# Patient Record
Sex: Male | Born: 1995 | Race: White | Hispanic: No | Marital: Single | Smoking: Never smoker
Health system: Southern US, Community
[De-identification: ages and names within clinical notes are randomized; demographics above are authoritative.]

## PROBLEM LIST (undated history)

## (undated) ENCOUNTER — Ambulatory Visit: Admission: EM

## (undated) DIAGNOSIS — M25561 Pain in right knee: Secondary | ICD-10-CM

## (undated) HISTORY — DX: Pain in right knee: M25.561

---
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
## 2016-02-01 ENCOUNTER — Ambulatory Visit (INDEPENDENT_AMBULATORY_CARE_PROVIDER_SITE_OTHER): Payer: 59 | Admitting: Physician Assistant

## 2016-02-01 VITALS — BP 142/80 | HR 83 | Temp 98.2°F | Resp 16 | Ht 70.0 in | Wt 221.8 lb

## 2016-02-01 DIAGNOSIS — J020 Streptococcal pharyngitis: Secondary | ICD-10-CM | POA: Diagnosis not present

## 2016-02-01 DIAGNOSIS — J029 Acute pharyngitis, unspecified: Secondary | ICD-10-CM | POA: Diagnosis not present

## 2016-02-01 LAB — POCT RAPID STREP A (OFFICE): Rapid Strep A Screen: NEGATIVE

## 2016-02-01 MED ORDER — AMOXICILLIN 500 MG PO CAPS: 500.0000 mg | ORAL_CAPSULE | Freq: Two times a day (BID) | ORAL | 0 refills | Status: AC

## 2016-02-01 NOTE — Progress Notes (Signed)
Tanner ChangJoshua Flores  MRN: [id number] DOB: 1996-03-15  PCP: No PCP Per Patient  Subjective:  Pt is a 20 year old male presenting to clinic for sore throat x3 days. He had a fever of 101.5 three days ago. Since that time he has developed a sore throat, burning with swallowing, difficulty sleeping. States he has to whisper because it hurts to talk. His mother noticed "white stuff" on his tonsils yesterday.  He had to leave work early yesterday, slept all afternoon.  He has tried salt water gargles, no relief  No sick contacts. Unsure if he has had mono in the past.  No recent sexual contacts.  Review of Systems  Constitutional: Positive for chills, fatigue and fever.  HENT: Positive for sore throat, trouble swallowing and voice change.   Respiratory: Negative.   Cardiovascular: Negative.   Gastrointestinal: Negative.   Skin: Negative for rash.    There are no active problems to display for this patient.   Current Outpatient Prescriptions on File Prior to Visit  Medication Sig Dispense Refill  . HYDROcodone-acetaminophen (NORCO) 5-325 MG tablet Take 1 tablet by mouth every 8 (eight) hours as needed for severe pain. For severe pain only. 15 tablet 0  . ibuprofen (ADVIL,MOTRIN) 600 MG tablet Take 1 tablet (600 mg total) by mouth every 8 (eight) hours as needed. 30 tablet 0  . SUMAtriptan (IMITREX) 25 MG tablet Take 25 mg by mouth as needed. headache  2  . cephALEXin (KEFLEX) 500 MG capsule Take 1 capsule (500 mg total) by mouth 3 (three) times daily. (Patient not taking: Reported on 02/01/2016) 21 capsule 0   No current facility-administered medications on file prior to visit.     No Known Allergies  Objective:  BP (!) 142/80 (BP Location: Right Arm, Patient Position: Sitting, Cuff Size: Normal)   Pulse 83   Temp 98.2 F (36.8 C) (Oral)   Resp 16   Ht 5\' 10"  (1.778 m)   Wt 221 lb 12.8 oz (100.6 kg)   SpO2 99%   BMI 31.82 kg/m   Physical Exam  Constitutional: He is oriented  to person, place, and time and well-developed, well-nourished, and in no distress. No distress.  HENT:  Mouth/Throat: Uvula is midline and mucous membranes are normal. No uvula swelling. Posterior oropharyngeal edema and posterior oropharyngeal erythema present. No oropharyngeal exudate or tonsillar abscesses.  Eyes: Conjunctivae are normal. Right eye exhibits no discharge. Left eye exhibits no discharge.  Cardiovascular: Normal rate, regular rhythm and normal heart sounds.   Pulmonary/Chest: Effort normal and breath sounds normal.  Lymphadenopathy:       Head (right side): Preauricular and posterior auricular adenopathy present.       Head (left side): Preauricular and posterior auricular adenopathy present.  Neurological: He is alert and oriented to person, place, and time. GCS score is 15.  Skin: Skin is warm and dry.  Psychiatric: Mood, memory, affect and judgment normal.  Vitals reviewed.  Results for orders placed or performed in visit on 02/01/16  POCT rapid strep A  Result Value Ref Range   Rapid Strep A Screen Negative Negative    Assessment and Plan :  1. Sore throat - POCT rapid strep A - Culture, Group A Strep 2. Streptococcal sore throat - amoxicillin (AMOXIL) 500 MG capsule; Take 1 capsule (500 mg total) by mouth 2 (two) times daily.  Dispense: 14 capsule; Refill: 0 - I believe this is a false positive strep test. Will treat.   Tanner Johan Creveling, PA-C  [facility] 02/01/2016 12:12 PM

## 2016-02-01 NOTE — Patient Instructions (Addendum)
IF you received an x-ray today, you will receive an invoice from [facility]. Please contact [facility] at [phone] with questions or concerns regarding your invoice.   IF you received labwork today, you will receive an invoice from [facility]. Please contact [facility] at [phone] with questions or concerns regarding your invoice.   Our billing staff will not be able to assist you with questions regarding bills from these companies.  You will be contacted with the lab results as soon as they are available. The fastest way to get your results is to activate your My Chart account. Instructions are located on the last page of this paperwork. If you have not heard from us regarding the results in 2 weeks, please contact this office.     s Strep Throat Strep throat is a bacterial infection of the throat. Your health care provider may call the infection tonsillitis or pharyngitis, depending on whether there is swelling in the tonsils or at the back of the throat. Strep throat is most common during the cold months of the year in children who are 255-19 years of age, but it can happen during any season in people of any age. This infection is spread from person to person (contagious) through coughing, sneezing, or close contact. CAUSES Strep throat is caused by the bacteria called Streptococcus pyogenes. RISK FACTORS This condition is more likely to develop in:  People who spend time in crowded places where the infection can spread easily.  People who have close contact with someone who has strep throat. SYMPTOMS Symptoms of this condition include:  Fever or chills.   Redness, swelling, or pain in the tonsils or throat.  Pain or difficulty when swallowing.  White or yellow spots on the tonsils or throat.  Swollen, tender glands in the neck or under the jaw.  Red rash all over the body (rare). DIAGNOSIS This condition is diagnosed  by performing a rapid strep test or by taking a swab of your throat (throat culture test). Results from a rapid strep test are usually ready in a few minutes, but throat culture test results are available after one or two days. TREATMENT This condition is treated with antibiotic medicine. HOME CARE INSTRUCTIONS Medicines  Take over-the-counter and prescription medicines only as told by your health care provider.  Take your antibiotic as told by your health care provider. Do not stop taking the antibiotic even if you start to feel better.  Have family members who also have a sore throat or fever tested for strep throat. They may need antibiotics if they have the strep infection. Eating and Drinking  Do not share food, drinking cups, or personal items that could cause the infection to spread to other people.  If swallowing is difficult, try eating soft foods until your sore throat feels better.  Drink enough fluid to keep your urine clear or pale yellow. General Instructions  Gargle with a salt-water mixture 3-4 times per day or as needed. To make a salt-water mixture, completely dissolve -1 tsp of salt in 1 cup of warm water.  Make sure that all household members wash their hands well.  Get plenty of rest.  Stay home from school or work until you have been taking antibiotics for 24 hours.  Keep all follow-up visits as told by your health care provider. This is important. SEEK MEDICAL CARE IF:  The glands in your neck continue to get bigger.  You develop a rash, cough,  or earache.  You cough up a thick liquid that is green, yellow-brown, or bloody.  You have pain or discomfort that does not get better with medicine.  Your problems seem to be getting worse rather than better.  You have a fever. SEEK IMMEDIATE MEDICAL CARE IF:  You have new symptoms, such as vomiting, severe headache, stiff or painful neck, chest pain, or shortness of breath.  You have severe throat pain,  drooling, or changes in your voice.  You have swelling of the neck, or the skin on the neck becomes red and tender.  You have signs of dehydration, such as fatigue, dry mouth, and decreased urination.  You become increasingly sleepy, or you cannot wake up completely.  Your joints become red or painful.   This information is not intended to replace advice given to you by your health care provider. Make sure you discuss any questions you have with your health care provider.   Document Released: 05/17/2000 Document Revised: 02/08/2015 Document Reviewed: 09/12/2014 Elsevier Interactive Patient Education [employer].

## 2016-02-03 LAB — CULTURE, GROUP A STREP: Organism ID, Bacteria: NORMAL

## 2016-04-03 HISTORY — PX: WISDOM TOOTH EXTRACTION: SHX21

## 2016-07-17 ENCOUNTER — Observation Stay (HOSPITAL_COMMUNITY)
Admission: EM | Admit: 2016-07-17 | Discharge: 2016-07-20 | Disposition: A | Discharge status: Home / Self Care | Payer: 59 | Attending: General Surgery | Admitting: General Surgery

## 2016-07-17 ENCOUNTER — Ambulatory Visit (INDEPENDENT_AMBULATORY_CARE_PROVIDER_SITE_OTHER): Payer: 59 | Admitting: Urgent Care

## 2016-07-17 ENCOUNTER — Encounter (HOSPITAL_COMMUNITY): Payer: Self-pay | Admitting: Emergency Medicine

## 2016-07-17 ENCOUNTER — Ambulatory Visit (INDEPENDENT_AMBULATORY_CARE_PROVIDER_SITE_OTHER): Payer: 59

## 2016-07-17 ENCOUNTER — Emergency Department (HOSPITAL_COMMUNITY): Payer: 59

## 2016-07-17 VITALS — BP 132/88 | HR 78 | Temp 98.1°F | Resp 18 | Ht 71.0 in | Wt 189.0 lb

## 2016-07-17 DIAGNOSIS — R109 Unspecified abdominal pain: Secondary | ICD-10-CM

## 2016-07-17 DIAGNOSIS — R110 Nausea: Secondary | ICD-10-CM

## 2016-07-17 DIAGNOSIS — I880 Nonspecific mesenteric lymphadenitis: Principal | ICD-10-CM

## 2016-07-17 DIAGNOSIS — R1031 Right lower quadrant pain: Secondary | ICD-10-CM | POA: Diagnosis not present

## 2016-07-17 DIAGNOSIS — K3580 Unspecified acute appendicitis: Secondary | ICD-10-CM | POA: Insufficient documentation

## 2016-07-17 LAB — CBC
HCT: 43.2 % (ref 39.0–52.0)
HEMOGLOBIN: 15 g/dL (ref 13.0–17.0)
MCH: 29.8 pg (ref 26.0–34.0)
MCHC: 34.7 g/dL (ref 30.0–36.0)
MCV: 85.9 fL (ref 78.0–100.0)
Platelets: 256 10*3/uL (ref 150–400)
RBC: 5.03 MIL/uL (ref 4.22–5.81)
RDW: 12.6 % (ref 11.5–15.5)
WBC: 5.7 10*3/uL (ref 4.0–10.5)

## 2016-07-17 LAB — POCT CBC
Granulocyte percent: 52.5 %G (ref 37–80)
HCT, POC: 42.4 % — AB (ref 43.5–53.7)
Hemoglobin: 15 g/dL (ref 14.1–18.1)
Lymph, poc: 2.1 (ref 0.6–3.4)
MCH, POC: 30.4 pg (ref 27–31.2)
MCHC: 35.4 g/dL (ref 31.8–35.4)
MCV: 85.9 fL (ref 80–97)
MID (cbc): 0.2 (ref 0–0.9)
MPV: 8.9 fL (ref 0–99.8)
POC Granulocyte: 2.5 (ref 2–6.9)
POC LYMPH PERCENT: 42.8 %L (ref 10–50)
POC MID %: 4.7 %M (ref 0–12)
Platelet Count, POC: 250 10*3/uL (ref 142–424)
RBC: 4.94 M/uL (ref 4.69–6.13)
RDW, POC: 13 %
WBC: 4.8 10*3/uL (ref 4.6–10.2)

## 2016-07-17 LAB — LIPASE, BLOOD: LIPASE: 22 U/L (ref 11–51)

## 2016-07-17 LAB — POCT URINALYSIS DIP (MANUAL ENTRY)
Bilirubin, UA: NEGATIVE
Blood, UA: NEGATIVE
Glucose, UA: NEGATIVE
Ketones, POC UA: NEGATIVE
Leukocytes, UA: NEGATIVE
Nitrite, UA: NEGATIVE
Spec Grav, UA: 1.015
Urobilinogen, UA: 0.2
pH, UA: 8.5

## 2016-07-17 LAB — BASIC METABOLIC PANEL
BUN/Creatinine Ratio: 21 — ABNORMAL HIGH (ref 9–20)
BUN: 16 mg/dL (ref 6–20)
CO2: 30 mmol/L — ABNORMAL HIGH (ref 18–29)
Calcium: 10.3 mg/dL — ABNORMAL HIGH (ref 8.7–10.2)
Chloride: 104 mmol/L (ref 96–106)
Creatinine, Ser: 0.76 mg/dL (ref 0.76–1.27)
GFR calc Af Amer: 153 mL/min/{1.73_m2} (ref >59)
GFR calc non Af Amer: 132 mL/min/{1.73_m2} (ref >59)
Glucose: 102 mg/dL — ABNORMAL HIGH (ref 65–99)
Potassium: 4.9 mmol/L (ref 3.5–5.2)
Sodium: 141 mmol/L (ref 134–144)

## 2016-07-17 LAB — POC MICROSCOPIC URINALYSIS (UMFC): Mucus: ABSENT

## 2016-07-17 LAB — COMPREHENSIVE METABOLIC PANEL
ALBUMIN: 5.1 g/dL — AB (ref 3.5–5.0)
ALT: 27 U/L (ref 17–63)
ANION GAP: 7 (ref 5–15)
AST: 27 U/L (ref 15–41)
Alkaline Phosphatase: 96 U/L (ref 38–126)
BUN: 16 mg/dL (ref 6–20)
CHLORIDE: 108 mmol/L (ref 101–111)
CO2: 27 mmol/L (ref 22–32)
Calcium: 9.9 mg/dL (ref 8.9–10.3)
Creatinine, Ser: 0.81 mg/dL (ref 0.61–1.24)
GFR calc non Af Amer: >60 mL/min (ref >60)
Glucose, Bld: 101 mg/dL — ABNORMAL HIGH (ref 65–99)
Potassium: 5 mmol/L (ref 3.5–5.1)
SODIUM: 142 mmol/L (ref 135–145)
Total Bilirubin: 0.9 mg/dL (ref 0.3–1.2)
Total Protein: 7.7 g/dL (ref 6.5–8.1)

## 2016-07-17 LAB — URINALYSIS, ROUTINE W REFLEX MICROSCOPIC
Bilirubin Urine: NEGATIVE
GLUCOSE, UA: NEGATIVE mg/dL
Hgb urine dipstick: NEGATIVE
KETONES UR: 20 mg/dL — AB
Leukocytes, UA: NEGATIVE
Nitrite: NEGATIVE
PROTEIN: 30 mg/dL — AB
SQUAMOUS EPITHELIAL / LPF: NONE SEEN
Specific Gravity, Urine: 1.027 (ref 1.005–1.030)
pH: 7 (ref 5.0–8.0)

## 2016-07-17 MED ORDER — ONDANSETRON HCL 4 MG/2ML IJ SOLN
4.0000 mg | Freq: Once | INTRAMUSCULAR | Status: AC
  Administered 2016-07-17: 4 mg via INTRAVENOUS
  Filled 2016-07-17: qty 2

## 2016-07-17 MED ORDER — IOPAMIDOL (ISOVUE-300) INJECTION 61%
100.0000 mL | Freq: Once | INTRAVENOUS | Status: AC | PRN
  Administered 2016-07-17: 100 mL via INTRAVENOUS

## 2016-07-17 MED ORDER — HYDROMORPHONE HCL 1 MG/ML IJ SOLN
1.0000 mg | Freq: Once | INTRAMUSCULAR | Status: AC
  Administered 2016-07-17: 1 mg via INTRAVENOUS
  Filled 2016-07-17: qty 1

## 2016-07-17 MED ORDER — KETOROLAC TROMETHAMINE 60 MG/2ML IM SOLN
60.0000 mg | Freq: Once | INTRAMUSCULAR | Status: AC
  Administered 2016-07-17: 60 mg via INTRAMUSCULAR

## 2016-07-17 MED ORDER — MORPHINE SULFATE (PF) 4 MG/ML IV SOLN
4.0000 mg | Freq: Once | INTRAVENOUS | Status: AC
  Administered 2016-07-17: 4 mg via INTRAVENOUS
  Filled 2016-07-17: qty 1

## 2016-07-17 MED ORDER — GLYCERIN (LAXATIVE) 2.1 G RE SUPP
1.0000 | Freq: Once | RECTAL | Status: AC
  Administered 2016-07-17: 1 via RECTAL
  Filled 2016-07-17: qty 1

## 2016-07-17 MED ORDER — ONDANSETRON 4 MG PO TBDP
4.0000 mg | ORAL_TABLET | Freq: Four times a day (QID) | ORAL | Status: DC | PRN
Start: 2016-07-17 — End: 2016-07-20

## 2016-07-17 MED ORDER — KCL IN DEXTROSE-NACL 20-5-0.45 MEQ/L-%-% IV SOLN
INTRAVENOUS | Status: DC
  Administered 2016-07-17: 20:00:00 via INTRAVENOUS
  Filled 2016-07-17 (×2): qty 1000

## 2016-07-17 MED ORDER — ONDANSETRON HCL 4 MG/2ML IJ SOLN
4.0000 mg | Freq: Four times a day (QID) | INTRAMUSCULAR | Status: DC | PRN
  Administered 2016-07-19: 4 mg via INTRAVENOUS
  Filled 2016-07-17: qty 2

## 2016-07-17 MED ORDER — PIPERACILLIN-TAZOBACTAM 3.375 G IVPB
3.3750 g | Freq: Three times a day (TID) | INTRAVENOUS | Status: DC
  Administered 2016-07-17 – 2016-07-18 (×2): 3.375 g via INTRAVENOUS
  Filled 2016-07-17 (×2): qty 50

## 2016-07-17 MED ORDER — KETOROLAC TROMETHAMINE 15 MG/ML IJ SOLN
15.0000 mg | Freq: Once | INTRAMUSCULAR | Status: AC
  Administered 2016-07-17: 15 mg via INTRAMUSCULAR
  Filled 2016-07-17: qty 1

## 2016-07-17 MED ORDER — SODIUM CHLORIDE 0.9 % IV SOLN
Freq: Once | INTRAVENOUS | Status: AC
  Administered 2016-07-17: 15:00:00 via INTRAVENOUS

## 2016-07-17 NOTE — ED Notes (Signed)
Surgeon at bedside.  

## 2016-07-17 NOTE — H&P (Signed)
Chief Complaint:  Right lower quadrant abdominal pain  History of Present Illness:  Tanner Flores is an 21 y.o. male who was seen in the ER this evening with a one day history of right lower quadrant and back pain.  No appetite today.  No history of nausea or vomiting and no diarrhea.  Sister had appendicitis.  No prior surgery.    History reviewed. No pertinent past medical history.  History reviewed. No pertinent surgical history.  No current facility-administered medications for this encounter.    Current Outpatient Prescriptions  Medication Sig Dispense Refill  . chlorpheniramine-HYDROcodone (TUSSIONEX) 10-8 MG/5ML SUER Take 5 mLs by mouth 2 (two) times daily.  0  . naproxen (NAPROSYN) 500 MG tablet Take 1 tablet by mouth 2 (two) times daily.  0   Patient has no known allergies. Family History  Problem Relation Age of Onset  . Cancer Maternal Grandmother   . Diabetes Maternal Grandmother   . Heart disease Maternal Grandfather   . Diabetes Paternal Grandmother    Social History:   reports that he has never smoked. He has never used smokeless tobacco. He reports that he does not drink alcohol or use drugs.   REVIEW OF SYSTEMS : Negative  Physical Exam:   Blood pressure 127/63, pulse 60, temperature 98.6 F (37 C), temperature source Oral, resp. rate 18, SpO2 98 %. There is no height or weight on file to calculate BMI.  Gen:  WDWN WM NAD  Neurological: Alert and oriented to person, place, and time. Motor and sensory function is grossly intact  Head: Normocephalic and atraumatic.  Eyes: Conjunctivae are normal. Pupils are equal, round, and reactive to light. No scleral icterus.  Neck: Normal range of motion. Neck supple. No tracheal deviation or thyromegaly present.  Cardiovascular:  SR without murmurs or gallops.  No carotid bruits Breast:  Not examined Respiratory: Effort normal.  No respiratory distress. No chest wall tenderness. Breath sounds normal.  No wheezes, rales  or rhonchi.  Abdomen:  Right lower quadrant abdominal pain.  Abdomen is soft; BS present.  Guarding noted.   GU:  unremarkable Musculoskeletal: Normal range of motion. Extremities are nontender. No cyanosis, edema or clubbing noted Lymphadenopathy: No cervical, preauricular, postauricular or axillary adenopathy is present Skin: Skin is warm and dry. No rash noted. No diaphoresis. No erythema. No pallor. Pscyh: Normal mood and affect. Behavior is normal. Judgment and thought content normal.   LABORATORY RESULTS: Results for orders placed or performed during the hospital encounter of 07/17/16 (from the past 48 hour(s))  Lipase, blood     Status: None   Collection Time: 07/17/16  1:33 PM  Result Value Ref Range   Lipase 22 11 - 51 U/L  Comprehensive metabolic panel     Status: Abnormal   Collection Time: 07/17/16  1:33 PM  Result Value Ref Range   Sodium 142 135 - 145 mmol/L   Potassium 5.0 3.5 - 5.1 mmol/L   Chloride 108 101 - 111 mmol/L   CO2 27 22 - 32 mmol/L   Glucose, Bld 101 (H) 65 - 99 mg/dL   BUN 16 6 - 20 mg/dL   Creatinine, Ser 0.81 0.61 - 1.24 mg/dL   Calcium 9.9 8.9 - 10.3 mg/dL   Total Protein 7.7 6.5 - 8.1 g/dL   Albumin 5.1 (H) 3.5 - 5.0 g/dL   AST 27 15 - 41 U/L   ALT 27 17 - 63 U/L   Alkaline Phosphatase 96 38 - 126 U/L  Total Bilirubin 0.9 0.3 - 1.2 mg/dL   GFR calc non Af Amer >60 >60 mL/min   GFR calc Af Amer >60 >60 mL/min    Comment: (NOTE) The eGFR has been calculated using the CKD EPI equation. This calculation has not been validated in all clinical situations. eGFR's persistently <60 mL/min signify possible Chronic Kidney Disease.    Anion gap 7 5 - 15  CBC     Status: None   Collection Time: 07/17/16  1:33 PM  Result Value Ref Range   WBC 5.7 4.0 - 10.5 K/uL   RBC 5.03 4.22 - 5.81 MIL/uL   Hemoglobin 15.0 13.0 - 17.0 g/dL   HCT 43.2 39.0 - 52.0 %   MCV 85.9 78.0 - 100.0 fL   MCH 29.8 26.0 - 34.0 pg   MCHC 34.7 30.0 - 36.0 g/dL   RDW 12.6 11.5 -  15.5 %   Platelets 256 150 - 400 K/uL  Urinalysis, Routine w reflex microscopic     Status: Abnormal   Collection Time: 07/17/16  3:17 PM  Result Value Ref Range   Color, Urine YELLOW YELLOW   APPearance CLEAR CLEAR   Specific Gravity, Urine 1.027 1.005 - 1.030   pH 7.0 5.0 - 8.0   Glucose, UA NEGATIVE NEGATIVE mg/dL   Hgb urine dipstick NEGATIVE NEGATIVE   Bilirubin Urine NEGATIVE NEGATIVE   Ketones, ur 20 (A) NEGATIVE mg/dL   Protein, ur 30 (A) NEGATIVE mg/dL   Nitrite NEGATIVE NEGATIVE   Leukocytes, UA NEGATIVE NEGATIVE   RBC / HPF 0-5 0 - 5 RBC/hpf   WBC, UA 0-5 0 - 5 WBC/hpf   Bacteria, UA RARE (A) NONE SEEN   Squamous Epithelial / LPF NONE SEEN NONE SEEN   Mucous PRESENT      RADIOLOGY RESULTS: Dg Abd 1 View  Result Date: 07/17/2016 CLINICAL DATA:  Right lower quadrant pain this morning EXAM: ABDOMEN - 1 VIEW COMPARISON:  None. FINDINGS: The bowel gas pattern is normal. No radio-opaque calculi or other significant radiographic abnormality are seen. IMPRESSION: Negative. Electronically Signed   By: Rolm Baptise M.D.   On: 07/17/2016 12:36   Ct Abdomen Pelvis W Contrast  Result Date: 07/17/2016 CLINICAL DATA:  Right lower quadrant pain EXAM: CT ABDOMEN AND PELVIS WITH CONTRAST TECHNIQUE: Multidetector CT imaging of the abdomen and pelvis was performed using the standard protocol following bolus administration of intravenous contrast. CONTRAST:  163m ISOVUE-300 IOPAMIDOL (ISOVUE-300) INJECTION 61% COMPARISON:  None. FINDINGS: Lower chest: No acute abnormality. Hepatobiliary: Diffuse decreased attenuation is noted within the liver consistent with fatty infiltration. The gallbladder is within normal limits. Pancreas: Unremarkable. No pancreatic ductal dilatation or surrounding inflammatory changes. Spleen: Normal in size without focal abnormality. Adrenals/Urinary Tract: Adrenal glands are unremarkable. Kidneys are normal, without renal calculi, focal lesion, or hydronephrosis.  Bladder is unremarkable. Stomach/Bowel: Stomach is within normal limits. Appendix appears normal. No evidence of bowel wall thickening, distention, or inflammatory changes. Vascular/Lymphatic: No significant vascular findings are present. No enlarged abdominal or pelvic lymph nodes. Reproductive: Prostate is unremarkable. Other: No abdominal wall hernia or abnormality. No abdominopelvic ascites. Musculoskeletal: No acute or significant osseous findings. IMPRESSION: No acute abnormality noted. Specifically the appendix is within normal limits. Electronically Signed   By: MInez CatalinaM.D.   On: 07/17/2016 16:02    Problem List: There are no active problems to display for this patient.   Assessment & Plan: Negative CT for appendicitis;  Normal WBC and no tachycardia.  I he  may have an early appendicitis.  Will admit and observe.  Will give Zosyn and observe without narcotics.  If pain persists, will likely proceed with lap appendectomy in the am.      Matt B. Hassell Done, MD, [facility] [phone] beeper [phone]  07/17/2016 6:51 PM

## 2016-07-17 NOTE — ED Provider Notes (Addendum)
[facility]-EMERGENCY DEPT Provider Note   CSN: [id number] Arrival date & time: 07/17/16  1302     History   Chief Complaint Chief Complaint  Patient presents with  . RLQ pain    Appendicitis?    HPI Tanner ChangJoshua Flores is a 21 y.o. male.  HPI Pt presents to the emergency department with progressive RLQ abdominal pain without testicular pain or urinary symptoms since last night. Chills without fever. Nausea without vomiting. No diarrhea. Pain is worse with palpation and movement. Never had pain lilke this before     History reviewed. No pertinent past medical history.  There are no active problems to display for this patient.   History reviewed. No pertinent surgical history.     Home Medications    Prior to Admission medications   Medication Sig Start Date End Date Taking? Authorizing Provider  chlorpheniramine-HYDROcodone (TUSSIONEX) 10-8 MG/5ML SUER Take 5 mLs by mouth 2 (two) times daily. 07/12/16  Yes Historical Provider, MD  naproxen (NAPROSYN) 500 MG tablet Take 1 tablet by mouth 2 (two) times daily. 07/08/16  Yes Historical Provider, MD    Family History Family History  Problem Relation Age of Onset  . Cancer Maternal Grandmother   . Diabetes Maternal Grandmother   . Heart disease Maternal Grandfather   . Diabetes Paternal Grandmother     Social History Social History  Substance Use Topics  . Smoking status: Never Smoker  . Smokeless tobacco: Never Used  . Alcohol use No     Allergies   Patient has no known allergies.   Review of Systems Review of Systems  All other systems reviewed and are negative.    Physical Exam Updated Vital Signs BP 121/71   Pulse 66   Temp 98.6 F (37 C) (Oral)   Resp 16   SpO2 96%   Physical Exam  Constitutional: He is oriented to person, place, and time. He appears well-developed and well-nourished.  HENT:  Head: Normocephalic and atraumatic.  Eyes: EOM are normal.  Neck: Normal range of motion.  Cardiovascular:  Normal rate, regular rhythm, normal heart sounds and intact distal pulses.   Pulmonary/Chest: Effort normal and breath sounds normal. No respiratory distress.  Abdominal: Soft. He exhibits no distension.  Focal RLQ tenderness with guarding, no rebound  Musculoskeletal: Normal range of motion.  Neurological: He is alert and oriented to person, place, and time.  Skin: Skin is warm and dry.  Psychiatric: He has a normal mood and affect. Judgment normal.  Nursing note and vitals reviewed.    ED Treatments / Results  Labs (all labs ordered are listed, but only abnormal results are displayed) Labs Reviewed  COMPREHENSIVE METABOLIC PANEL - Abnormal; Notable for the following:       Result Value   Glucose, Bld 101 (*)    Albumin 5.1 (*)    All other components within normal limits  URINALYSIS, ROUTINE W REFLEX MICROSCOPIC - Abnormal; Notable for the following:    Ketones, ur 20 (*)    Protein, ur 30 (*)    Bacteria, UA RARE (*)    All other components within normal limits  LIPASE, BLOOD  CBC    EKG  EKG Interpretation None       Radiology Dg Abd 1 View  Result Date: 07/17/2016 CLINICAL DATA:  Right lower quadrant pain this morning EXAM: ABDOMEN - 1 VIEW COMPARISON:  None. FINDINGS: The bowel gas pattern is normal. No radio-opaque calculi or other significant radiographic abnormality are seen. IMPRESSION: Negative. Electronically  Signed   By: Charlett Nose M.D.   On: 07/17/2016 12:36   Ct Abdomen Pelvis W Contrast  Result Date: 07/17/2016 CLINICAL DATA:  Right lower quadrant pain EXAM: CT ABDOMEN AND PELVIS WITH CONTRAST TECHNIQUE: Multidetector CT imaging of the abdomen and pelvis was performed using the standard protocol following bolus administration of intravenous contrast. CONTRAST:  ISOVUE-300 IOPAMIDOL (ISOVUE-300) INJECTION 61% COMPARISON:  None. FINDINGS: Lower chest: No acute abnormality. Hepatobiliary: Diffuse decreased attenuation is noted within the liver  consistent with fatty infiltration. The gallbladder is within normal limits. Pancreas: Unremarkable. No pancreatic ductal dilatation or surrounding inflammatory changes. Spleen: Normal in size without focal abnormality. Adrenals/Urinary Tract: Adrenal glands are unremarkable. Kidneys are normal, without renal calculi, focal lesion, or hydronephrosis. Bladder is unremarkable. Stomach/Bowel: Stomach is within normal limits. Appendix appears normal. No evidence of bowel wall thickening, distention, or inflammatory changes. Vascular/Lymphatic: No significant vascular findings are present. No enlarged abdominal or pelvic lymph nodes. Reproductive: Prostate is unremarkable. Other: No abdominal wall hernia or abnormality. No abdominopelvic ascites. Musculoskeletal: No acute or significant osseous findings. IMPRESSION: No acute abnormality noted. Specifically the appendix is within normal limits. Electronically Signed   By: Alcide Clever M.D.   On: 07/17/2016 16:02    Procedures Procedures (including critical care time)  Medications Ordered in ED Medications  morphine 4 MG/ML injection 4 mg (4 mg Intravenous Given 07/17/16 1448)  ondansetron (ZOFRAN) injection 4 mg (4 mg Intravenous Given 07/17/16 1447)  0.9 %  sodium chloride infusion ( Intravenous New Bag/Given 07/17/16 1444)  iopamidol (ISOVUE-300) 61 % injection 100 mL (100 mLs Intravenous Contrast Given 07/17/16 1535)  morphine 4 MG/ML injection 4 mg (4 mg Intravenous Given 07/17/16 1618)     Initial Impression / Assessment and Plan / ED Course  I have reviewed the triage vital signs and the nursing notes.  Pertinent labs & imaging results that were available during my care of the patient were reviewed by me and considered in my medical decision making (see chart for details).     Despite normal CT and normal WBC count i'm still concerned about early appendicitis. Will have general surgery evaluate the patient at the bedside. Pt and father were offered  dc home with repeat visit tomorrow, but they would prefer to see general surgery at this time  5:06 PM Discussed with Dr Wenda Low, MD, general surgery who will evaluate the patient at the bedside  Final Clinical Impressions(s) / ED Diagnoses   Final diagnoses:  None    New Prescriptions New Prescriptions   No medications on file     Azalia Bilis, MD 07/17/16 1653    Azalia Bilis, MD 07/17/16 1706

## 2016-07-17 NOTE — Patient Instructions (Addendum)
PLEASE REPORT TO THE EMERGENCY ROOM AT [facility] IMMEDIATELY. THE ADDRESS IS: Address: [address], [place], [place] [address] Phone: [phone]  MY CONCERN IS THAT YOU MAY HAVE APPENDICITIS. YOU WILL NEED AN EMERGENT EVALUATION INCLUDING CT IMAGING BUT A DIFFERENT SET OF PROVIDERS WILL SEE YOU THERE. THEY WILL HAVE ACCESS TO OUR INFORMATION AND LAB WORK THAT WE HAVE ALREADY DONE.    Appendicitis Introduction The appendix is a tube that is shaped like a finger. It is connected to the large intestine. Appendicitis means that this tube is swollen (inflamed). Without treatment, the tube can tear (rupture). This can lead to a life-threatening infection. It can also cause you to have sores (abscesses). These sores hurt. What are the causes? This condition may be caused by something that blocks the appendix, such as:  A ball of poop (stool).  Lymph glands that are bigger than normal. Sometimes, the cause is not known. What are the signs or symptoms? Symptoms of this condition include:  Pain around the belly button (navel).  The pain moves toward the lower right belly (abdomen).  The pain can get worse with time.  The pain can get worse if you cough.  The pain can get worse if you move suddenly.  Tenderness in the lower right belly.  Feeling sick to your stomach (nauseous).  Throwing up (vomiting).  Not feeling hungry (loss of appetite).  A fever.  Having a hard time pooping (constipation).  Watery poop (diarrhea).  Not feeling well. How is this treated? Usually, this condition is treated by taking out the appendix (appendectomy). There are two ways that the appendix can be taken out:  Open surgery. In this surgery, the appendix is taken out through a large cut (incision). The cut is made in the lower right belly. This surgery may be picked if:  You have scars from another surgery.  You have a bleeding condition.  You are pregnant and will be having your baby  soon.  You have a condition that does not allow the other type of surgery.  Laparoscopic surgery. In this surgery, the appendix is taken out through small cuts. Often, this surgery:  Causes less pain.  Causes fewer problems.  Is easier to heal from. If your appendix tears and a sore forms:  A drain may be put into the sore. The drain will be used to get rid of fluid.  You may get an antibiotic medicine through an IV tube.  Your appendix may or may not need to be taken out. This information is not intended to replace advice given to you by your health care provider. Make sure you discuss any questions you have with your health care provider. Document Released: 08/12/2011 Document Revised: 10/26/2015 Document Reviewed: 10/05/2014  2017 Elsevier     IF you received an x-ray today, you will receive an invoice from [facility]. Please contact [facility] at [phone] with questions or concerns regarding your invoice.   IF you received labwork today, you will receive an invoice from [facility]. Please contact [facility] at [phone] with questions or concerns regarding your invoice.   Our billing staff will not be able to assist you with questions regarding bills from these companies.  You will be contacted with the lab results as soon as they are available. The fastest way to get your results is to activate your My Chart account. Instructions are located on the last page of this paperwork. If you have not heard from us regarding  the results in 2 weeks, please contact this office.

## 2016-07-17 NOTE — Progress Notes (Signed)
MRN: [id number] DOB: [birth year]  Subjective:   Tanner Flores is a 21 y.o. male presenting for chief complaint of Back Pain and Flank Pain (Right sided begin this am, gradually getting worse)  Reports sudden onset of worsening right sided flank/abdominal pain this morning. Rates pain severe (14/10). Has had nausea, difficulty urinating. Denies fever, vomiting, dysuria, hematuria, diarrhea, bloody stools. Occasionally smokes cigarettes. Denies alcohol use. He is currently taking naproxen, was using cough syrup. He also just finished course of azithromycin on 07/13/2016 for respiratory infection. Denies currently using opioid medications.  Tanner Flores currently has no medications in their medication list. Also has No Known Allergies.  Tanner Flores  has no past medical history on file. Also  has no past surgical history on file.  Objective:   Vitals: BP 132/88   Pulse 78   Temp 98.1 F (36.7 C) (Oral)   Resp 18   Ht 5\' 11"  (1.803 m)   Wt 189 lb (85.7 kg)   SpO2 100%   BMI 26.36 kg/m   Physical Exam  Constitutional: He is oriented to person, place, and time. He appears well-developed and well-nourished.  HENT:  Mouth/Throat: Oropharynx is clear and moist.  Eyes: No scleral icterus.  Cardiovascular: Normal rate, regular rhythm and intact distal pulses.  Exam reveals no gallop and no friction rub.   No murmur heard. Pulmonary/Chest: No respiratory distress. He has no wheezes. He has no rales.  Abdominal: Soft. Bowel sounds are normal. He exhibits no distension and no mass. There is tenderness (RLQ>RUQ, positive pain at McBurney's point, positive Rovsing sign). There is no guarding.  Right sided CVA tenderness.  Neurological: He is alert and oriented to person, place, and time.  Skin: Skin is warm. He is diaphoretic.   Results for orders placed or performed in visit on 07/17/16 (from the past 24 hour(s))  POCT CBC     Status: Abnormal   Collection Time: 07/17/16 11:54 AM  Result Value Ref  Range   WBC 4.8 4.6 - 10.2 K/uL   Lymph, poc 2.1 0.6 - 3.4   POC LYMPH PERCENT 42.8 10 - 50 %L   MID (cbc) 0.2 0 - 0.9   POC MID % 4.7 0 - 12 %M   POC Granulocyte 2.5 2 - 6.9   Granulocyte percent 52.5 37 - 80 %G   RBC 4.94 4.69 - 6.13 M/uL   Hemoglobin 15.0 14.1 - 18.1 g/dL   HCT, POC 40.9 (A) 81.1 - 53.7 %   MCV 85.9 80 - 97 fL   MCH, POC 30.4 27 - 31.2 pg   MCHC 35.4 31.8 - 35.4 g/dL   RDW, POC 91.4 %   Platelet Count, POC 250 142 - 424 K/uL   MPV 8.9 0 - 99.8 fL  POCT Microscopic Urinalysis ([facility])     Status: Abnormal   Collection Time: 07/17/16 12:12 PM  Result Value Ref Range   WBC,UR,HPF,POC None None WBC/hpf   RBC,UR,HPF,POC None None RBC/hpf   Bacteria None None, Too numerous to count   Mucus Absent Absent   Epithelial Cells, UR Per Microscopy Few (A) None, Too numerous to count cells/hpf  POCT urinalysis dipstick     Status: Abnormal   Collection Time: 07/17/16 12:13 PM  Result Value Ref Range   Color, UA yellow yellow   Clarity, UA clear clear   Glucose, UA negative negative   Bilirubin, UA negative negative   Ketones, POC UA negative negative   Spec Grav, UA 1.015    Blood,  UA negative negative   pH, UA 8.5    Protein Ur, POC trace (A) negative   Urobilinogen, UA 0.2    Nitrite, UA Negative Negative   Leukocytes, UA Negative Negative   Dg Abd 1 View  Result Date: 07/17/2016 CLINICAL DATA:  Right lower quadrant pain this morning EXAM: ABDOMEN - 1 VIEW COMPARISON:  None. FINDINGS: The bowel gas pattern is normal. No radio-opaque calculi or other significant radiographic abnormality are seen. IMPRESSION: Negative. Electronically Signed   By: Charlett NoseKevin  Dover M.D.   On: 07/17/2016 12:36   Assessment and Plan :   1. Right lower quadrant pain 2. Right flank pain 3. Nausea without vomiting - Will send patient to [facility] ED immediately for rule out of appendicitis given acute onset of severe RLQ quadrant pain over McBurney's point, positive Rovsing sign. IT trainerCharge  Nurse notified at [facility]. Patient agreed to present there immediately.  Wallis BambergMario Cauy Melody, PA-C [facility] [phone] 07/17/2016  11:10 AM

## 2016-07-17 NOTE — ED Triage Notes (Signed)
Pt reports RLQ pain , [facility] Possible appendicitis. Blood work been done. Pt appears in distress.

## 2016-07-18 ENCOUNTER — Observation Stay (HOSPITAL_COMMUNITY): Payer: 59 | Admitting: Certified Registered Nurse Anesthetist

## 2016-07-18 ENCOUNTER — Encounter (HOSPITAL_COMMUNITY)
Admission: EM | Disposition: A | Discharge status: Home / Self Care | Payer: Self-pay | Source: Home / Self Care | Attending: Emergency Medicine

## 2016-07-18 HISTORY — PX: LAPAROSCOPIC APPENDECTOMY: SHX408

## 2016-07-18 LAB — CBC WITH DIFFERENTIAL/PLATELET
BASOS ABS: 0 10*3/uL (ref 0.0–0.1)
BASOS PCT: 0 %
Eosinophils Absolute: 0.2 10*3/uL (ref 0.0–0.7)
Eosinophils Relative: 4 %
HEMATOCRIT: 40 % (ref 39.0–52.0)
HEMOGLOBIN: 13.5 g/dL (ref 13.0–17.0)
Lymphocytes Relative: 47 %
Lymphs Abs: 2.4 10*3/uL (ref 0.7–4.0)
MCH: 29.5 pg (ref 26.0–34.0)
MCHC: 33.8 g/dL (ref 30.0–36.0)
MCV: 87.5 fL (ref 78.0–100.0)
Monocytes Absolute: 0.5 10*3/uL (ref 0.1–1.0)
Monocytes Relative: 10 %
NEUTROS ABS: 2 10*3/uL (ref 1.7–7.7)
NEUTROS PCT: 39 %
Platelets: 175 10*3/uL (ref 150–400)
RBC: 4.57 MIL/uL (ref 4.22–5.81)
RDW: 12.8 % (ref 11.5–15.5)
WBC: 5.1 10*3/uL (ref 4.0–10.5)

## 2016-07-18 LAB — HIV ANTIBODY (ROUTINE TESTING W REFLEX): HIV Screen 4th Generation wRfx: NONREACTIVE

## 2016-07-18 LAB — SURGICAL PCR SCREEN
MRSA, PCR: NEGATIVE
STAPHYLOCOCCUS AUREUS: NEGATIVE

## 2016-07-18 SURGERY — APPENDECTOMY, LAPAROSCOPIC
Anesthesia: General | Site: Abdomen

## 2016-07-18 MED ORDER — KCL IN DEXTROSE-NACL 20-5-0.45 MEQ/L-%-% IV SOLN
INTRAVENOUS | Status: DC
  Administered 2016-07-18 – 2016-07-19 (×3): via INTRAVENOUS
  Filled 2016-07-18 (×3): qty 1000

## 2016-07-18 MED ORDER — HYDROMORPHONE HCL 1 MG/ML IJ SOLN
INTRAMUSCULAR | Status: AC
  Filled 2016-07-18: qty 1

## 2016-07-18 MED ORDER — DEXAMETHASONE SODIUM PHOSPHATE 10 MG/ML IJ SOLN
INTRAMUSCULAR | Status: AC
  Filled 2016-07-18: qty 1

## 2016-07-18 MED ORDER — ROCURONIUM BROMIDE 50 MG/5ML IV SOSY
PREFILLED_SYRINGE | INTRAVENOUS | Status: AC
  Filled 2016-07-18: qty 5

## 2016-07-18 MED ORDER — FENTANYL CITRATE (PF) 100 MCG/2ML IJ SOLN
INTRAMUSCULAR | Status: AC
  Filled 2016-07-18: qty 2

## 2016-07-18 MED ORDER — ONDANSETRON HCL 4 MG/2ML IJ SOLN
INTRAMUSCULAR | Status: DC | PRN
  Administered 2016-07-18: 4 mg via INTRAVENOUS

## 2016-07-18 MED ORDER — HYDROCODONE-ACETAMINOPHEN 5-325 MG PO TABS
1.0000 | ORAL_TABLET | ORAL | Status: DC | PRN
  Administered 2016-07-18 (×2): 2 via ORAL
  Filled 2016-07-18 (×2): qty 2

## 2016-07-18 MED ORDER — LIDOCAINE 2% (20 MG/ML) 5 ML SYRINGE
INTRAMUSCULAR | Status: AC
  Filled 2016-07-18: qty 5

## 2016-07-18 MED ORDER — LACTATED RINGERS IV SOLN
INTRAVENOUS | Status: DC | PRN
  Administered 2016-07-18: 1 via INTRAVENOUS

## 2016-07-18 MED ORDER — MIDAZOLAM HCL 5 MG/5ML IJ SOLN
INTRAMUSCULAR | Status: DC | PRN
  Administered 2016-07-18: 2 mg via INTRAVENOUS

## 2016-07-18 MED ORDER — MIDAZOLAM HCL 2 MG/2ML IJ SOLN
INTRAMUSCULAR | Status: AC
  Filled 2016-07-18: qty 2

## 2016-07-18 MED ORDER — BUPIVACAINE HCL (PF) 0.5 % IJ SOLN
INTRAMUSCULAR | Status: AC
  Filled 2016-07-18: qty 30

## 2016-07-18 MED ORDER — SUCCINYLCHOLINE CHLORIDE 200 MG/10ML IV SOSY
PREFILLED_SYRINGE | INTRAVENOUS | Status: DC | PRN
  Administered 2016-07-18: 100 mg via INTRAVENOUS

## 2016-07-18 MED ORDER — MORPHINE SULFATE (PF) 2 MG/ML IV SOLN: 2.0000 mg | INTRAVENOUS | Status: DC | PRN

## 2016-07-18 MED ORDER — LIDOCAINE 2% (20 MG/ML) 5 ML SYRINGE
INTRAMUSCULAR | Status: DC | PRN
  Administered 2016-07-18: 100 mg via INTRAVENOUS

## 2016-07-18 MED ORDER — MORPHINE SULFATE (PF) 2 MG/ML IV SOLN
1.0000 mg | INTRAVENOUS | Status: DC | PRN
  Administered 2016-07-18: 1 mg via INTRAVENOUS
  Filled 2016-07-18: qty 1

## 2016-07-18 MED ORDER — ACETAMINOPHEN 10 MG/ML IV SOLN
INTRAVENOUS | Status: AC
  Filled 2016-07-18: qty 100

## 2016-07-18 MED ORDER — MORPHINE SULFATE (PF) 4 MG/ML IV SOLN
2.0000 mg | INTRAVENOUS | Status: DC | PRN
Start: 2016-07-18 — End: 2016-07-19
  Administered 2016-07-18: 6 mg via INTRAVENOUS
  Administered 2016-07-18: 4 mg via INTRAVENOUS
  Administered 2016-07-18: 2 mg via INTRAVENOUS
  Filled 2016-07-18: qty 1
  Filled 2016-07-18: qty 2
  Filled 2016-07-18: qty 1

## 2016-07-18 MED ORDER — PROPOFOL 10 MG/ML IV BOLUS
INTRAVENOUS | Status: DC | PRN
  Administered 2016-07-18: 200 mg via INTRAVENOUS

## 2016-07-18 MED ORDER — ESMOLOL HCL 100 MG/10ML IV SOLN
INTRAVENOUS | Status: DC | PRN
  Administered 2016-07-18: 20 mg via INTRAVENOUS

## 2016-07-18 MED ORDER — ESMOLOL HCL 100 MG/10ML IV SOLN
INTRAVENOUS | Status: AC
  Filled 2016-07-18: qty 10

## 2016-07-18 MED ORDER — ROCURONIUM BROMIDE 50 MG/5ML IV SOSY
PREFILLED_SYRINGE | INTRAVENOUS | Status: DC | PRN
  Administered 2016-07-18: 35 mg via INTRAVENOUS

## 2016-07-18 MED ORDER — FENTANYL CITRATE (PF) 100 MCG/2ML IJ SOLN
INTRAMUSCULAR | Status: AC
  Filled 2016-07-18: qty 4

## 2016-07-18 MED ORDER — PROMETHAZINE HCL 25 MG/ML IJ SOLN
INTRAMUSCULAR | Status: AC
  Filled 2016-07-18: qty 1

## 2016-07-18 MED ORDER — DEXAMETHASONE SODIUM PHOSPHATE 10 MG/ML IJ SOLN
INTRAMUSCULAR | Status: DC | PRN
  Administered 2016-07-18: 10 mg via INTRAVENOUS

## 2016-07-18 MED ORDER — ACETAMINOPHEN 10 MG/ML IV SOLN
INTRAVENOUS | Status: DC | PRN
  Administered 2016-07-18: 1000 mg via INTRAVENOUS

## 2016-07-18 MED ORDER — LACTATED RINGERS IV SOLN
INTRAVENOUS | Status: DC
  Administered 2016-07-18: 11:00:00 via INTRAVENOUS

## 2016-07-18 MED ORDER — SUCCINYLCHOLINE CHLORIDE 200 MG/10ML IV SOSY
PREFILLED_SYRINGE | INTRAVENOUS | Status: AC
  Filled 2016-07-18: qty 10

## 2016-07-18 MED ORDER — FENTANYL CITRATE (PF) 100 MCG/2ML IJ SOLN
INTRAMUSCULAR | Status: DC | PRN
  Administered 2016-07-18 (×2): 50 ug via INTRAVENOUS
  Administered 2016-07-18 (×2): 100 ug via INTRAVENOUS

## 2016-07-18 MED ORDER — BUPIVACAINE HCL (PF) 0.5 % IJ SOLN
INTRAMUSCULAR | Status: DC | PRN
  Administered 2016-07-18: 14 mL

## 2016-07-18 MED ORDER — SUGAMMADEX SODIUM 200 MG/2ML IV SOLN
INTRAVENOUS | Status: AC
  Filled 2016-07-18: qty 2

## 2016-07-18 MED ORDER — SUGAMMADEX SODIUM 200 MG/2ML IV SOLN
INTRAVENOUS | Status: DC | PRN
  Administered 2016-07-18: 200 mg via INTRAVENOUS

## 2016-07-18 MED ORDER — PROPOFOL 10 MG/ML IV BOLUS
INTRAVENOUS | Status: AC
  Filled 2016-07-18: qty 20

## 2016-07-18 MED ORDER — HYDROMORPHONE HCL 1 MG/ML IJ SOLN
0.2500 mg | INTRAMUSCULAR | Status: DC | PRN
  Administered 2016-07-18 (×4): 0.5 mg via INTRAVENOUS

## 2016-07-18 MED ORDER — PROMETHAZINE HCL 25 MG/ML IJ SOLN
6.2500 mg | INTRAMUSCULAR | Status: DC | PRN
  Administered 2016-07-18: 6.25 mg via INTRAVENOUS

## 2016-07-18 MED ORDER — ONDANSETRON HCL 4 MG/2ML IJ SOLN
INTRAMUSCULAR | Status: AC
  Filled 2016-07-18: qty 2

## 2016-07-18 SURGICAL SUPPLY — 45 items
APPLIER CLIP 5 13 M/L LIGAMAX5 (MISCELLANEOUS)
APPLIER CLIP ROT 10 11.4 M/L (STAPLE) ×3
BENZOIN TINCTURE PRP APPL 2/3 (GAUZE/BANDAGES/DRESSINGS) ×3 IMPLANT
CABLE HIGH FREQUENCY MONO STRZ (ELECTRODE) ×3 IMPLANT
CHLORAPREP W/TINT 26ML (MISCELLANEOUS) ×3 IMPLANT
CLIP APPLIE 5 13 M/L LIGAMAX5 (MISCELLANEOUS) IMPLANT
CLIP APPLIE ROT 10 11.4 M/L (STAPLE) ×1 IMPLANT
CLOSURE WOUND 1/2 X4 (GAUZE/BANDAGES/DRESSINGS) ×1
COVER SURGICAL LIGHT HANDLE (MISCELLANEOUS) ×3 IMPLANT
CUTTER FLEX LINEAR 45M (STAPLE) IMPLANT
DECANTER SPIKE VIAL GLASS SM (MISCELLANEOUS) ×3 IMPLANT
DRAIN CHANNEL 19F RND (DRAIN) IMPLANT
DRAPE LAPAROSCOPIC ABDOMINAL (DRAPES) ×3 IMPLANT
DRSG TEGADERM 2-3/8X2-3/4 SM (GAUZE/BANDAGES/DRESSINGS) ×9 IMPLANT
DRSG TEGADERM 4X4.75 (GAUZE/BANDAGES/DRESSINGS) ×3 IMPLANT
ELECT REM PT RETURN 9FT ADLT (ELECTROSURGICAL) ×3
ELECTRODE REM PT RTRN 9FT ADLT (ELECTROSURGICAL) ×1 IMPLANT
ENDOLOOP SUT PDS II  0 18 (SUTURE)
ENDOLOOP SUT PDS II 0 18 (SUTURE) IMPLANT
EVACUATOR SILICONE 100CC (DRAIN) IMPLANT
GAUZE SPONGE 2X2 8PLY STRL LF (GAUZE/BANDAGES/DRESSINGS) ×1 IMPLANT
GLOVE ECLIPSE 8.0 STRL XLNG CF (GLOVE) ×3 IMPLANT
GLOVE INDICATOR 8.0 STRL GRN (GLOVE) ×3 IMPLANT
GOWN STRL REUS W/TWL XL LVL3 (GOWN DISPOSABLE) ×6 IMPLANT
IRRIG SUCT STRYKERFLOW 2 WTIP (MISCELLANEOUS) ×3
IRRIGATION SUCT STRKRFLW 2 WTP (MISCELLANEOUS) ×1 IMPLANT
KIT BASIN OR (CUSTOM PROCEDURE TRAY) ×3 IMPLANT
POUCH SPECIMEN RETRIEVAL 10MM (ENDOMECHANICALS) ×3 IMPLANT
RELOAD 45 VASCULAR/THIN (ENDOMECHANICALS) ×3 IMPLANT
RELOAD STAPLE TA45 3.5 REG BLU (ENDOMECHANICALS) ×3 IMPLANT
SCISSORS LAP 5X35 DISP (ENDOMECHANICALS) ×3 IMPLANT
SHEARS HARMONIC ACE PLUS 36CM (ENDOMECHANICALS) ×3 IMPLANT
SLEEVE XCEL OPT CAN 5 100 (ENDOMECHANICALS) ×3 IMPLANT
SPONGE GAUZE 2X2 STER 10/PKG (GAUZE/BANDAGES/DRESSINGS) ×2
STRIP CLOSURE SKIN 1/2X4 (GAUZE/BANDAGES/DRESSINGS) ×2 IMPLANT
SUT ETHILON 3 0 PS 1 (SUTURE) IMPLANT
SUT MNCRL AB 4-0 PS2 18 (SUTURE) ×3 IMPLANT
TOWEL OR 17X26 10 PK STRL BLUE (TOWEL DISPOSABLE) ×3 IMPLANT
TOWEL OR NON WOVEN STRL DISP B (DISPOSABLE) ×3 IMPLANT
TRAY FOLEY W/METER SILVER 14FR (SET/KITS/TRAYS/PACK) ×3 IMPLANT
TRAY FOLEY W/METER SILVER 16FR (SET/KITS/TRAYS/PACK) ×3 IMPLANT
TRAY LAPAROSCOPIC (CUSTOM PROCEDURE TRAY) ×3 IMPLANT
TROCAR BLADELESS OPT 5 100 (ENDOMECHANICALS) ×3 IMPLANT
TROCAR XCEL BLUNT TIP 100MML (ENDOMECHANICALS) ×3 IMPLANT
TUBING INSUF HEATED (TUBING) ×3 IMPLANT

## 2016-07-18 NOTE — Anesthesia Postprocedure Evaluation (Addendum)
Anesthesia Post Note  Patient: Tanner Flores  Procedure(s) Performed: Procedure(s) (LRB): APPENDECTOMY LAPAROSCOPIC (N/A)  Patient location during evaluation: PACU Anesthesia Type: General Level of consciousness: awake, sedated and oriented Pain management: pain level controlled Vital Signs Assessment: post-procedure vital signs reviewed and stable Respiratory status: spontaneous breathing, nonlabored ventilation, respiratory function stable and patient connected to nasal cannula oxygen Cardiovascular status: blood pressure returned to baseline and stable Postop Assessment: no signs of nausea or vomiting Anesthetic complications: no       Last Vitals:  Vitals:   07/18/16 1300 07/18/16 1315  BP: 130/77 108/70  Pulse: 69 (!) 46  Resp: 14 16  Temp:      Last Pain:  Vitals:   07/18/16 1330  TempSrc:   PainSc: 5                  Lahari Suttles,JAMES TERRILL

## 2016-07-18 NOTE — Discharge Instructions (Signed)
[facility] ______[facility] °LAPAROSCOPIC APPENDECTOMY SURGERY: POST OP INSTRUCTIONS °Always review your discharge instruction sheet given to you by the facility where your surgery was performed. °IF YOU HAVE DISABILITY OR FAMILY LEAVE FORMS, YOU MUST BRING THEM TO THE OFFICE FOR PROCESSING.   °DO NOT GIVE THEM TO YOUR DOCTOR. ° °1. A prescription for pain medication may be given to you upon discharge.  Take your pain medication as prescribed, if needed.  If narcotic pain medicine is not needed, then you may take acetaminophen (Tylenol) or ibuprofen (Advil) as needed. °2. Take your usually prescribed medications unless otherwise directed. °3. If you need a refill on your pain medication, please contact your pharmacy.  They will contact our office to request authorization. Prescriptions will not be filled after 5pm or on week-ends. °4. You should follow a light diet the first few days after arrival home, such as soup and crackers, etc.  Be sure to include lots of fluids daily.  May start lowfat, solid foods 2 days after the surgery. °5. Most patients will experience some swelling and bruising in the area of the incisions.  Ice packs will help.  Swelling and bruising can take several days to resolve.  °6. It is common to experience some constipation if taking pain medication after surgery.  Increasing fluid intake and taking a stool softener (such as Colace) will usually help or prevent this problem from occurring.  A mild laxative (Milk of Magnesia or Miralax) should be taken according to package instructions if there are no bowel movements after 48 hours. °7. Unless discharge instructions indicate otherwise, you may remove your bandages on 3 days after surgery.  You may shower when you get home.  You may have steri-strips (small skin tapes) in place directly over the incision.  These strips should be left on the skin until they fall off.  If your surgeon used skin glue on the incision, you may shower in  24 hours.  The glue will flake off over the next 2-3 weeks.  Any sutures or staples will be removed at the office during your follow-up visit. °8. ACTIVITIES:  You may resume regular (light) daily activities beginning the next day--such as daily self-care, walking, climbing stairs--gradually increasing activities as tolerated.  You may have sexual intercourse when it is comfortable.  Refrain from any heavy lifting or straining for two weeks.  Do not lift anything over 10 pounds during that time.  °a. You may drive when you are no longer taking prescription pain medication, you can comfortably wear a seatbelt, and you can safely maneuver your car and apply brakes. °b. RETURN TO WORK:  Desk type work in 1 week, full duty work in 2 weeks if you are pain-free.________________________________________________________ °9. You should see your doctor or a PA in the office for a follow-up appointment approximately 2-3 weeks after your surgery.  Make sure that you call for this appointment within a day or two after you arrive home to insure a convenient appointment time. °10. OTHER INSTRUCTIONS: __________________________________________________________________________________________________________________________ __________________________________________________________________________________________________________________________ °WHEN TO CALL YOUR DOCTOR: °1. Fever over 101.0 °2. Inability to urinate °3. Continued bleeding from incision. °4. Increased pain, redness, or drainage from the incision. °5. Increasing abdominal pain ° °The clinic staff is available to answer your questions during regular business hours.  Please don’t hesitate to call and ask to speak to one of the nurses for clinical concerns.  If you have a medical emergency, go to the nearest emergency room or call 911.    A surgeon from [facility] is always on call at the hospital. °[address], [address], [place], [place]  [address] ?  [address] [address], [place],    [address] °[phone] ? [phone] ? FAX [phone] °Web site: [email or web address] ° °

## 2016-07-18 NOTE — Progress Notes (Signed)
Initial Nutrition Assessment  DOCUMENTATION CODES:   Not applicable  INTERVENTION:   RD will continue to follow and will order supplements when diet advanced if pt with inadequate oral intake after surgery.    NUTRITION DIAGNOSIS:   Inadequate oral intake related to inability to eat as evidenced by NPO status.  GOAL:   Patient will meet greater than or equal to 90% of their needs  MONITOR:   Weight trends, Labs, Diet advancement  REASON FOR ASSESSMENT:   Malnutrition Screening Tool    ASSESSMENT:   21 y.o. male who was seen in the ER this evening with a one day history of right lower quadrant and back pain. Admitted for possible appendicitis   Met with pt and family in room today. Pt reports good appetite up until Tuesday night when he started having abdominal pain and nausea. Pt has not eaten anything since then. Pt currently NPO and scheduled to have an appendectomy today. 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 Pt reports that he eats the same foods, just smaller portions. RD suspects this pt will eat well post op. RD will continue to follow and order supplements if inadequate oral intake at that time.   Medications reviewed and include: zosyn  Labs reviewed: wnl   Nutrition-Focused physical exam completed. Findings are no fat depletion, no muscle depletion, and no edema.   Diet Order:  Diet NPO time specified  Skin:  Reviewed, no issues  Last BM:  2/14  Height:   Ht Readings from Last 1 Encounters:  07/17/16 5' 11"  (1.803 m) (69 %, Z= 0.50)*   * Growth percentiles are based on CDC 2-20 Years data.    Weight:   Wt Readings from Last 1 Encounters:  07/17/16 189 lb (85.7 kg) (87 %, Z= 1.12)*   * Growth percentiles are based on CDC 2-20 Years data.    Ideal Body Weight:  78.1  kg  BMI:  Body mass index is 26.36 kg/m.  Estimated Nutritional Needs:   Kcal:  2300-2600kcal/day   Protein:  95-112g/day   Fluid:  >2.3L/day   EDUCATION NEEDS:   No education needs identified at this time  Koleen Distance, RD, LDN Pager #[phone]

## 2016-07-18 NOTE — Anesthesia Preprocedure Evaluation (Signed)
Anesthesia Evaluation  Patient identified by MRN, date of birth, ID band Patient awake    Reviewed: Allergy & Precautions, NPO status , Patient's Chart, lab work & pertinent test results  Airway Mallampati: I       Dental  (+) Teeth Intact   Pulmonary neg pulmonary ROS,    breath sounds clear to auscultation       Cardiovascular negative cardio ROS   Rhythm:Regular Rate:Bradycardia     Neuro/Psych negative neurological ROS  negative psych ROS   GI/Hepatic Neg liver ROS, Acute appy   Endo/Other  negative endocrine ROS  Renal/GU negative Renal ROS  negative genitourinary   Musculoskeletal negative musculoskeletal ROS (+)   Abdominal   Peds negative pediatric ROS (+)  Hematology negative hematology ROS (+)   Anesthesia Other Findings   Reproductive/Obstetrics negative OB ROS                             Anesthesia Physical Anesthesia Plan  ASA: I and emergent  Anesthesia Plan: General   Post-op Pain Management:    Induction: Intravenous  Airway Management Planned: Oral ETT  Additional Equipment:   Intra-op Plan:   Post-operative Plan: Extubation in OR  Informed Consent: I have reviewed the patients History and Physical, chart, labs and discussed the procedure including the risks, benefits and alternatives for the proposed anesthesia with the patient or authorized representative who has indicated his/her understanding and acceptance.   Dental advisory given  Plan Discussed with:   Anesthesia Plan Comments:         Anesthesia Quick Evaluation

## 2016-07-18 NOTE — Progress Notes (Signed)
Assessment Active Problems:   Right lower quadrant abdominal pain persists; remains afebrile with nl WBC; could be atypical appendicitis vs mesenteric adenitis.  He is not getting better.   Plan:  Diagnostic laparoscopy, appendectomy. I have discussed the procedure, rationale, and risks of the operation. The risks include but are not limited to bleeding, infection, wound problems, anesthesia, injury to intra-abdominal organs, possibility of not finding the source of the pain. He seems to understand and agrees with the plan.   LOS: 0 days        Subjective: Still having intermittent severe RLQ pain. Family in room.  Objective: Vital signs in last 24 hours: Temp:  [97.6 F (36.4 C)-98.6 F (37 C)] 98.2 F (36.8 C) (02/15 0902) Pulse Rate:  [38-78] 46 (02/15 0904) Resp:  [16-18] 16 (02/15 0902) BP: (105-146)/(63-88) 125/63 (02/15 0902) SpO2:  [96 %-100 %] 100 % (02/15 0902) Weight:  [85.7 kg (189 lb)] 85.7 kg (189 lb) (02/14 1920) Last BM Date: 07/17/16  Intake/Output from previous day: 02/14 0701 - 02/15 0700 In: 2291.7 [I.V.:2191.7; IV Piggyback:100] Out: -  Intake/Output this shift: No intake/output data recorded.  PE: General- In NAD Abdomen-soft, mild RUQ tenderness, RLQ tenderness and guarding.  Lab Results:   Recent Labs  07/17/16 1333 07/18/16 0515  WBC 5.7 5.1  HGB 15.0 13.5  HCT 43.2 40.0  PLT 256 175   BMET  Recent Labs  07/17/16 1152 07/17/16 1333  NA 141 142  K 4.9 5.0  CL 104 108  CO2 30* 27  GLUCOSE 102* 101*  BUN 16 16  CREATININE 0.76 0.81  CALCIUM 10.3* 9.9   PT/INR No results for input(s): LABPROT, INR in the last 72 hours. Comprehensive Metabolic Panel:    Component Value Date/Time   NA 142 07/17/2016 1333   NA 141 07/17/2016 1152   NA 136 07/09/2015 1601   K 5.0 07/17/2016 1333   K 4.9 07/17/2016 1152   CL 108 07/17/2016 1333   CL 104 07/17/2016 1152   CO2 27 07/17/2016 1333   CO2 30 (H) 07/17/2016 1152   BUN 16  07/17/2016 1333   BUN 16 07/17/2016 1152   BUN 10 07/09/2015 1601   CREATININE 0.81 07/17/2016 1333   CREATININE 0.76 07/17/2016 1152   GLUCOSE 101 (H) 07/17/2016 1333   GLUCOSE 102 (H) 07/17/2016 1152   GLUCOSE 104 (H) 07/09/2015 1601   CALCIUM 9.9 07/17/2016 1333   CALCIUM 10.3 (H) 07/17/2016 1152   AST 27 07/17/2016 1333   AST 21 07/09/2015 1601   ALT 27 07/17/2016 1333   ALT 23 07/09/2015 1601   ALKPHOS 96 07/17/2016 1333   ALKPHOS 97 07/09/2015 1601   BILITOT 0.9 07/17/2016 1333   BILITOT 0.8 07/09/2015 1601   PROT 7.7 07/17/2016 1333   PROT 6.4 (L) 07/09/2015 1601   ALBUMIN 5.1 (H) 07/17/2016 1333   ALBUMIN 4.0 07/09/2015 1601     Studies/Results: Dg Abd 1 View  Result Date: 07/17/2016 CLINICAL DATA:  Right lower quadrant pain this morning EXAM: ABDOMEN - 1 VIEW COMPARISON:  None. FINDINGS: The bowel gas pattern is normal. No radio-opaque calculi or other significant radiographic abnormality are seen. IMPRESSION: Negative. Electronically Signed   By: Charlett NoseKevin  Dover M.D.   On: 07/17/2016 12:36   Ct Abdomen Pelvis W Contrast  Result Date: 07/17/2016 CLINICAL DATA:  Right lower quadrant pain EXAM: CT ABDOMEN AND PELVIS WITH CONTRAST TECHNIQUE: Multidetector CT imaging of the abdomen and pelvis was performed using the standard protocol following  bolus administration of intravenous contrast. CONTRAST:  ISOVUE-300 IOPAMIDOL (ISOVUE-300) INJECTION 61% COMPARISON:  None. FINDINGS: Lower chest: No acute abnormality. Hepatobiliary: Diffuse decreased attenuation is noted within the liver consistent with fatty infiltration. The gallbladder is within normal limits. Pancreas: Unremarkable. No pancreatic ductal dilatation or surrounding inflammatory changes. Spleen: Normal in size without focal abnormality. Adrenals/Urinary Tract: Adrenal glands are unremarkable. Kidneys are normal, without renal calculi, focal lesion, or hydronephrosis. Bladder is unremarkable. Stomach/Bowel: Stomach is  within normal limits. Appendix appears normal. No evidence of bowel wall thickening, distention, or inflammatory changes. Vascular/Lymphatic: No significant vascular findings are present. No enlarged abdominal or pelvic lymph nodes. Reproductive: Prostate is unremarkable. Other: No abdominal wall hernia or abnormality. No abdominopelvic ascites. Musculoskeletal: No acute or significant osseous findings. IMPRESSION: No acute abnormality noted. Specifically the appendix is within normal limits. Electronically Signed   By: Alcide Clever M.D.   On: 07/17/2016 16:02    Anti-infectives: Anti-infectives    Start     Dose/Rate Route Frequency Ordered Stop   07/17/16 2000  piperacillin-tazobactam (ZOSYN) IVPB 3.375 g     3.375 g 12.5 mL/hr over 240 Minutes Intravenous Every 8 hours 07/17/16 1943         Micheline Markes J 07/18/2016

## 2016-07-18 NOTE — Anesthesia Procedure Notes (Signed)
Procedure Name: Intubation Date/Time: 07/18/2016 11:40 AM Performed by: Maxwell Caul Pre-anesthesia Checklist: Patient identified, Emergency Drugs available, Suction available and Patient being monitored Patient Re-evaluated:Patient Re-evaluated prior to inductionOxygen Delivery Method: Circle system utilized Preoxygenation: Pre-oxygenation with 100% oxygen Intubation Type: IV induction, Rapid sequence and Cricoid Pressure applied Laryngoscope Size: 4 and Mac Grade View: Grade I Tube type: Oral Tube size: 7.5 mm Number of attempts: 1 Airway Equipment and Method: Stylet Placement Confirmation: ETT inserted through vocal cords under direct vision,  positive ETCO2 and breath sounds checked- equal and bilateral Secured at: 21 cm Tube secured with: Tape Dental Injury: Teeth and Oropharynx as per pre-operative assessment

## 2016-07-18 NOTE — Transfer of Care (Signed)
Immediate Anesthesia Transfer of Care Note  Patient: Tanner Flores  Procedure(s) Performed: Procedure(s): APPENDECTOMY LAPAROSCOPIC (N/A)  Patient Location: PACU  Anesthesia Type:General  Level of Consciousness:  sedated, patient cooperative and responds to stimulation  Airway & Oxygen Therapy:Patient Spontanous Breathing and Patient connected to face mask oxgen  Post-op Assessment:  Report given to PACU RN and Post -op Vital signs reviewed and stable  Post vital signs:  Reviewed and stable  Last Vitals:  Vitals:   07/18/16 0904 07/18/16 1247  BP:  (!) 144/82  Pulse: (!) 46 91  Resp:  16  Temp:  36.5 C    Complications: No apparent anesthesia complications

## 2016-07-18 NOTE — Op Note (Signed)
OPERATIVE NOTE-DIAGNOSTIC LAPAROSCOPY  Pre-operative Diagnosis: Persistent right lower quadrant pain  Post-operative Diagnosis: Mesenteric adenitis  Procedure:  Diagnostic laparoscopy and incidental appendectomy  Surgeon:  Avel Peace, M.D.  Anesthesia:  General  Indications:  This is a 21 year old male who has typical clinical (history and physical) findings for acute appendicitis. However, he has no fever and white blood cell count is normal. CT scan without oral contrast did not demonstrate any obvious abnormalities of the appendix. He was admitted last night for observation. He continues to have significant pain along with tenderness and guarding in the right lower quadrant at McBurney's point. He is now brought to the operating room for the above procedure.  Findings: The appendix appeared normal. The gallbladder is normal. The right colon was normal. The distal ileum appeared normal. The rest of the ileum appeared normal and there is no Meckel's diverticulum. There were prominent lymph nodes in the terminal ileum mesentery suspicious for mesenteric adenitis.   Procedure Details   He was brought to the operating room, placed in the supine position and general anesthesia was induced, along with placement of orogastric tube, SCDs, and a Foley catheter. A timeout was performed. The abdomen was prepped and draped in a sterile fashion. A small infraumbilical incision was made through the skin, subcutaneous tissue, fascia, and peritoneum entering the peritoneal cavity under direct vision.  A pursestring suture was passed around the fascia with a 0 Vicryl.  The Hasson was introduced into the peritoneal cavity and the tails of the suture were used to hold the Hasson in place.   The pneumoperitoneum was then established to steady pressure of 15 mmHg.   The laparoscope was introduced and there was no evidence of bleeding or underlying organ injury. Additional 5 mm cannulas then placed in the left  lower quadrant of the abdomen and the right upper quadrant region under direct visualization. A careful evaluation of the entire abdomen was carried out. The patient was placed in Trendelenburg and left lateral decubitus position. The small intestines were retracted in the cephalad and left lateral direction away from the pelvis and right lower quadrant.   Inspection of the appendix did not demonstrate any acute inflammatory changes. The cecum and right colon appeared normal. The gallbladder appeared normal. I ran the ileum and there was no evidence of inflammatory change or Meckel's diverticulum. In the terminal ileum mesentery, however, there were prominent lymph nodes noted. This was consistent with mesenteric adenitis. I decided to proceed with incidental appendectomy.   The appendix was carefully mobilized. The mesoappendix was was divided with the harmonic scalpel.   The appendix was amputated off the cecum, with a small cuff of cecum, using an endo-GIA stapler.  The appendix was placed in a retrieval bag and removed through the subumbilical port incision. Bleeding from the staple line was controlled with clips. There is no evidence of leak from the staple line.  Copious irrigation was  performed and irrigant fluid suctioned from the abdomen as much as possible. Inspection of the abdominal cavity demonstrated no evidence of bleeding or organ injury.   The umbilical trocar was removed and the  port site fascia was closed via the purse string suture under laparoscopic vision. There was no residual palpable fascial defect.  The remaining trocars were removed and all  trocar site skin wounds were closed with 4-0 Monocryl.  Instrument, sponge, and needle counts were correct at the conclusion of the case.   Estimated Blood Loss:  100 cc  Drains: none         Specimens: appendix         Complications:  None; patient tolerated the procedure well.         Disposition: PACU - hemodynamically  stable.         Condition: stable

## 2016-07-19 ENCOUNTER — Encounter (HOSPITAL_COMMUNITY): Payer: Self-pay | Admitting: General Surgery

## 2016-07-19 DIAGNOSIS — I880 Nonspecific mesenteric lymphadenitis: Secondary | ICD-10-CM

## 2016-07-19 MED ORDER — OXYCODONE HCL 5 MG PO TABS
5.0000 mg | ORAL_TABLET | ORAL | Status: DC | PRN
  Administered 2016-07-19 – 2016-07-20 (×5): 10 mg via ORAL
  Filled 2016-07-19 (×5): qty 2

## 2016-07-19 MED ORDER — IBUPROFEN 200 MG PO TABS: 600.0000 mg | ORAL_TABLET | Freq: Four times a day (QID) | ORAL | Status: DC | PRN

## 2016-07-19 MED ORDER — HYDROMORPHONE HCL 1 MG/ML IJ SOLN
1.0000 mg | INTRAMUSCULAR | Status: DC | PRN
  Administered 2016-07-19: 2 mg via INTRAVENOUS
  Administered 2016-07-19 (×2): 1 mg via INTRAVENOUS
  Administered 2016-07-19 – 2016-07-20 (×2): 2 mg via INTRAVENOUS
  Filled 2016-07-19 (×2): qty 2
  Filled 2016-07-19: qty 1
  Filled 2016-07-19: qty 2
  Filled 2016-07-19: qty 1

## 2016-07-19 MED ORDER — KETOROLAC TROMETHAMINE 30 MG/ML IJ SOLN
30.0000 mg | Freq: Four times a day (QID) | INTRAMUSCULAR | Status: DC
  Administered 2016-07-19 (×3): 30 mg via INTRAVENOUS
  Filled 2016-07-19 (×4): qty 1

## 2016-07-19 NOTE — Progress Notes (Signed)
Pt having complaints of severe pain unrelieved by ordered norco and morphine. Pt currently rating pain 10 on a scale of 1-10. Dr. Carolynne Edouardoth, on call physician notified. Orders received for additional pain medication.

## 2016-07-19 NOTE — Progress Notes (Signed)
Assessment Principal Problem:   Acute RLQ mesenteric adenitis s/p laparoscopy and incidental appendectomy 07/18/16-having incisional pain; trying not to take pain meds.    Plan:  Add Toradol.  Change Norco to Oxycodone.  Advance diet as tolerated.  Ambulate.   LOS: 0 days     1 Day Post-Op  Subjective: Pain around incisions.  Trying not to take pain meds.  Tolerating liquids.  We discussed findings at surgery.  Objective: Vital signs in last 24 hours: Temp:  [97.5 F (36.4 C)-98.6 F (37 C)] 98 F (36.7 C) (02/16 0559) Pulse Rate:  [40-91] 41 (02/16 0559) Resp:  [11-18] 18 (02/16 0559) BP: (108-148)/(52-89) 125/62 (02/16 0559) SpO2:  [91 %-100 %] 99 % (02/16 0559) Last BM Date: 07/17/16  Intake/Output from previous day: 02/15 0701 - 02/16 0700 In: 3125.4 [P.O.:240; I.V.:2885.4] Out: 850 [Urine:825; Blood:25] Intake/Output this shift: No intake/output data recorded.  PE: General- In NAD Abdomen-soft, tender around incisions, dried blood on umbilical bandage.  Lab Results:   Recent Labs  07/17/16 1333 07/18/16 0515  WBC 5.7 5.1  HGB 15.0 13.5  HCT 43.2 40.0  PLT 256 175   BMET  Recent Labs  07/17/16 1152 07/17/16 1333  NA 141 142  K 4.9 5.0  CL 104 108  CO2 30* 27  GLUCOSE 102* 101*  BUN 16 16  CREATININE 0.76 0.81  CALCIUM 10.3* 9.9   PT/INR No results for input(s): LABPROT, INR in the last 72 hours. Comprehensive Metabolic Panel:    Component Value Date/Time   NA 142 07/17/2016 1333   NA 141 07/17/2016 1152   NA 136 07/09/2015 1601   K 5.0 07/17/2016 1333   K 4.9 07/17/2016 1152   CL 108 07/17/2016 1333   CL 104 07/17/2016 1152   CO2 27 07/17/2016 1333   CO2 30 (H) 07/17/2016 1152   BUN 16 07/17/2016 1333   BUN 16 07/17/2016 1152   BUN 10 07/09/2015 1601   CREATININE 0.81 07/17/2016 1333   CREATININE 0.76 07/17/2016 1152   GLUCOSE 101 (H) 07/17/2016 1333   GLUCOSE 102 (H) 07/17/2016 1152   GLUCOSE 104 (H) 07/09/2015 1601   CALCIUM  9.9 07/17/2016 1333   CALCIUM 10.3 (H) 07/17/2016 1152   AST 27 07/17/2016 1333   AST 21 07/09/2015 1601   ALT 27 07/17/2016 1333   ALT 23 07/09/2015 1601   ALKPHOS 96 07/17/2016 1333   ALKPHOS 97 07/09/2015 1601   BILITOT 0.9 07/17/2016 1333   BILITOT 0.8 07/09/2015 1601   PROT 7.7 07/17/2016 1333   PROT 6.4 (L) 07/09/2015 1601   ALBUMIN 5.1 (H) 07/17/2016 1333   ALBUMIN 4.0 07/09/2015 1601     Studies/Results: Dg Abd 1 View  Result Date: 07/17/2016 CLINICAL DATA:  Right lower quadrant pain this morning EXAM: ABDOMEN - 1 VIEW COMPARISON:  None. FINDINGS: The bowel gas pattern is normal. No radio-opaque calculi or other significant radiographic abnormality are seen. IMPRESSION: Negative. Electronically Signed   By: Charlett Nose M.D.   On: 07/17/2016 12:36   Ct Abdomen Pelvis W Contrast  Result Date: 07/17/2016 CLINICAL DATA:  Right lower quadrant pain EXAM: CT ABDOMEN AND PELVIS WITH CONTRAST TECHNIQUE: Multidetector CT imaging of the abdomen and pelvis was performed using the standard protocol following bolus administration of intravenous contrast. CONTRAST:  ISOVUE-300 IOPAMIDOL (ISOVUE-300) INJECTION 61% COMPARISON:  None. FINDINGS: Lower chest: No acute abnormality. Hepatobiliary: Diffuse decreased attenuation is noted within the liver consistent with fatty infiltration. The gallbladder is within normal  limits. Pancreas: Unremarkable. No pancreatic ductal dilatation or surrounding inflammatory changes. Spleen: Normal in size without focal abnormality. Adrenals/Urinary Tract: Adrenal glands are unremarkable. Kidneys are normal, without renal calculi, focal lesion, or hydronephrosis. Bladder is unremarkable. Stomach/Bowel: Stomach is within normal limits. Appendix appears normal. No evidence of bowel wall thickening, distention, or inflammatory changes. Vascular/Lymphatic: No significant vascular findings are present. No enlarged abdominal or pelvic lymph nodes. Reproductive: Prostate  is unremarkable. Other: No abdominal wall hernia or abnormality. No abdominopelvic ascites. Musculoskeletal: No acute or significant osseous findings. IMPRESSION: No acute abnormality noted. Specifically the appendix is within normal limits. Electronically Signed   By: Alcide CleverMark  Lukens M.D.   On: 07/17/2016 16:02    Anti-infectives: Anti-infectives    Start     Dose/Rate Route Frequency Ordered Stop   07/17/16 2000  piperacillin-tazobactam (ZOSYN) IVPB 3.375 g  Status:  Discontinued     3.375 g 12.5 mL/hr over 240 Minutes Intravenous Every 8 hours 07/17/16 1943 07/18/16 1302       Brynlie Daza J 07/19/2016

## 2016-07-19 NOTE — Discharge Summary (Signed)
  Physician Discharge Summary  Patient ID: Tanner Flores MRN: [id number] DOB/AGE: 1995/06/28 21 y.o.  Admit date: 07/17/2016 Discharge date: 07/20/2016  Admission Diagnoses:  RLQ pain with normal appendix on CT   Discharge Diagnoses:   Mesenteric adenitis   Principal Problem:   Acute mesenteric adenitis Active Problems:   Right lower quadrant abdominal pain   PROCEDURES: Diagnostic laparoscopy and incidental appendectomy, 07/18/16, Dr. Hector Shadeodd Rosenbower  Hospital Course:   This is a 21 year old male who has typical clinical (history and physical) findings for acute appendicitis. However, he has no fever and white blood cell count is normal. CT scan without oral contrast did not demonstrate any obvious abnormalities of the appendix. He was admitted last night for observation. He continues to have significant pain along with tenderness and guarding in the right lower quadrant at McBurney's point. He is now brought to the operating room for the above procedure.  Findings: The appendix appeared normal. The gallbladder is normal. The right colon was normal. The distal ileum appeared normal. The rest of the ileum appeared normal and there is no Meckel's diverticulum. There were prominent lymph nodes in the terminal ileum mesentery suspicious for mesenteric adenitis.  Pt tolerated the procedure well and was returned to the floor. Pt had quite a bit of soreness on POD 1.  He was doing better, passing gas, ambulating independently, and eating adequate calories for discharge on POD 2.  Surgical pathology was positive for acute appendicitis.      Disposition: 01-Home or Self Care  Discharge Instructions    Call MD for:  persistant nausea and vomiting    Complete by:  As directed    Call MD for:  redness, tenderness, or signs of infection (pain, swelling, redness, odor or green/yellow discharge around incision site)    Complete by:  As directed    Call MD for:  severe uncontrolled pain     Complete by:  As directed    Call MD for:  temperature >100.4    Complete by:  As directed    Diet - low sodium heart healthy    Complete by:  As directed    Increase activity slowly    Complete by:  As directed      Allergies as of 07/20/2016   No Known Allergies     Medication List    STOP taking these medications   naproxen 500 MG tablet Commonly known as:  NAPROSYN     TAKE these medications   chlorpheniramine-HYDROcodone 10-8 MG/5ML Suer Commonly known as:  TUSSIONEX Take 5 mLs by mouth 2 (two) times daily.   ibuprofen 600 MG tablet Commonly known as:  ADVIL,MOTRIN Take 1 tablet (600 mg total) by mouth 3 (three) times daily. Take three times daily for 1 week, then three times a day as needed.  Take with food.   oxyCODONE 5 MG immediate release tablet Commonly known as:  Oxy IR/ROXICODONE Take 1-2 tablets (5-10 mg total) by mouth every 4 (four) hours as needed for moderate pain.      Follow-up Information    [facility] Follow up.   Specialty:  General Surgery Why:  If you do not hear from our office on Monday call and ask for DOW appointment in 2-3 weeks.   Contact information: [address] [place] [address] [phone]           Signed: Almond LintBYERLY,Xian Apostol 07/20/2016, 9:57 AM

## 2016-07-20 MED ORDER — IBUPROFEN 200 MG PO TABS: 600.0000 mg | ORAL_TABLET | Freq: Four times a day (QID) | ORAL | Status: DC | PRN

## 2016-07-20 MED ORDER — OXYCODONE HCL 5 MG PO TABS: 5.0000 mg | ORAL_TABLET | ORAL | 0 refills | Status: DC | PRN

## 2016-07-20 MED ORDER — IBUPROFEN 600 MG PO TABS: 600.0000 mg | ORAL_TABLET | Freq: Three times a day (TID) | ORAL | 0 refills | Status: DC

## 2016-07-20 NOTE — Progress Notes (Signed)
Pt is discharged to home. DC instructions given. No concerns voiced. Prescription given for two pain meds. Pt refused toradol . Left unit in wheelchair pushed by nurse tech accompanied by father. Left in good condition. VWilliams, rn.

## 2016-08-01 ENCOUNTER — Ambulatory Visit (INDEPENDENT_AMBULATORY_CARE_PROVIDER_SITE_OTHER): Payer: 59 | Admitting: Family Medicine

## 2016-08-01 ENCOUNTER — Ambulatory Visit (INDEPENDENT_AMBULATORY_CARE_PROVIDER_SITE_OTHER): Payer: 59

## 2016-08-01 VITALS — BP 150/80 | HR 102 | Temp 98.4°F | Resp 18 | Ht 71.0 in | Wt 188.4 lb

## 2016-08-01 DIAGNOSIS — R1031 Right lower quadrant pain: Secondary | ICD-10-CM | POA: Diagnosis not present

## 2016-08-01 LAB — POCT URINALYSIS DIP (MANUAL ENTRY)
BILIRUBIN UA: NEGATIVE
BILIRUBIN UA: NEGATIVE
Blood, UA: NEGATIVE
GLUCOSE UA: NEGATIVE
LEUKOCYTES UA: NEGATIVE
Nitrite, UA: NEGATIVE
PH UA: 7
Protein Ur, POC: 30 — AB
Spec Grav, UA: 1.02
Urobilinogen, UA: 2

## 2016-08-01 LAB — POCT CBC
GRANULOCYTE PERCENT: 69.9 % (ref 37–80)
HCT, POC: 40.4 % — AB (ref 43.5–53.7)
HEMOGLOBIN: 13.9 g/dL — AB (ref 14.1–18.1)
Lymph, poc: 1.7 (ref 0.6–3.4)
MCH, POC: 29.9 pg (ref 27–31.2)
MCHC: 34.3 g/dL (ref 31.8–35.4)
MCV: 87.1 fL (ref 80–97)
MID (cbc): 0.4 (ref 0–0.9)
MPV: 8.1 fL (ref 0–99.8)
PLATELET COUNT, POC: 250 10*3/uL (ref 142–424)
POC Granulocyte: 4.9 (ref 2–6.9)
POC LYMPH %: 23.9 % (ref 10–50)
POC MID %: 6.2 %M (ref 0–12)
RBC: 4.64 M/uL — AB (ref 4.69–6.13)
RDW, POC: 13 %
WBC: 7 10*3/uL (ref 4.6–10.2)

## 2016-08-01 MED ORDER — HYDROCODONE-ACETAMINOPHEN 10-325 MG PO TABS: 1.0000 | ORAL_TABLET | Freq: Three times a day (TID) | ORAL | 0 refills | Status: DC | PRN

## 2016-08-01 MED ORDER — KETOROLAC TROMETHAMINE 30 MG/ML IJ SOLN
30.0000 mg | Freq: Once | INTRAMUSCULAR | Status: AC
  Administered 2016-08-01: 30 mg via INTRAMUSCULAR

## 2016-08-01 NOTE — Patient Instructions (Addendum)
We are getting you set up for a CT scan of your abdomen.  We will let you know the results.    The other option, as we discussed, is going straight to the ED.  They will be able to do the CT scan immediately as well as have someone to give you the results and do something about them.    I have given you enough pain medicine to get to your appointment at the CT scan.   If the pain starts getting worse or your start vomiting, do NOT wait and go straight to the ED.  For your constipation, the pain medicine will make it worse.  Take a colace stool softener twice a day.  This will make it easier to go.  However this will not make you go to the bathroom. You should also get over-the-counter MiraLAX which is a laxative that actually makes you go to the bathroom. These 2 things together will help some with your abdominal pain.  It was good to see you today.   GO TO [place] 215PM  START PREP 1230 AND 130PM  NPO AFTER 9AM  PATIENT AWARE BUT REFUSED NOW TO HAVE THE TEST, HE STATES HIS DAD TOLD HIM TO HAVE US CANCEL AND HE WANTS TO JUST SEE IF IT GETS BETTER BECAUSE HE JUST HAD A CT SCAN AND SURGERY. I TOLD HIM IF INSURANCE WAS A CONCERN WE WOULD PA, HE SAID NO CANCEL It. DR Skip MayerWALDEN AWARE  IF you received an x-ray today, you will receive an invoice from [facility]. Please contact [facility] at [phone] with questions or concerns regarding your invoice.   IF you received labwork today, you will receive an invoice from [facility]. Please contact [facility] at [phone] with questions or concerns regarding your invoice.   Our billing staff will not be able to assist you with questions regarding bills from these companies.  You will be contacted with the lab results as soon as they are available. The fastest way to get your results is to activate your My Chart account. Instructions are located on the last page of this paperwork. If you have not heard from us regarding the results  in 2 weeks, please contact this office.

## 2016-08-01 NOTE — Progress Notes (Signed)
Tanner Flores is a 21 y.o. male who presents to Primary Care at [facility] today for abdominal pain:  1.  Abdominal pain:  21 yo M with recent PMH of RLQ pain concerning for appendicitis for which he was evaluated at the emergency department and then admitted to the hospital. He had surgery on February 15 with appendectomy and intraoperative diagnosis of mesenteric adenitis. He never had a white count nor fever. He did have acute abdominal findings consistent with either appendicitis or mesenteric adenitis. He was discharged home after surgery. He felt better for a few days after surgery. He had pain relievers at home which also provided him some relief.    He is somewhat nauseous.  He has vomited 4 times since leaving the hospital, but none in past several days.  He is eating some and drinking well.  Good urine output.    However pain has been worsening in past week.  DEscribes sharp stabbing pain in Right abdomen.  Not sleeping well at night due to pain.  He has been moving bowels once every day or so.  Not today.  Is passing flatus.  He did have BM yesterday, but feels it's a little harder than usual.  Did not have BM 4 days prior to surgery or several days afterwards.    ROS as above.  No fevers, chills.    PMH reviewed. Patient is a nonsmoker.   History reviewed. No pertinent past medical history. Past Surgical History:  Procedure Laterality Date  . LAPAROSCOPIC APPENDECTOMY N/A 07/18/2016   Procedure: APPENDECTOMY LAPAROSCOPIC;  Surgeon: Avel Peace, MD;  Location: [facility];  Service: General;  Laterality: N/A;    Medications reviewed. No current outpatient prescriptions on file.   No current facility-administered medications for this visit.      Physical Exam:  BP (!) 150/80   Pulse (!) 102   Temp 98.4 F (36.9 C) (Oral)   Resp 18   Ht 5\' 11"  (1.803 m)   Wt 188 lb 6.4 oz (85.5 kg)   SpO2 100%   BMI 26.28 kg/m  Gen:  Alert, cooperative patient who appears stated age in no  acute distress.  Vital signs reviewed. Head: [place]/AT.   Eyes:  EOMI, PERRL.   Ears:  External ears WNL, no ear drainage. Nose:  Septum midline  Mouth:  MMM, tonsils non-erythematous, non-edematous.   Neck:  Supple without LAD Pulm:  Clear to auscultation bilaterally with good air movement.   Cardiac:  Regular rate and rhythm without murmur auscultated.   Abd:  Soft/nondistended.  TTP RLQ.  Also with some guarding in RLQ and some bruising noted just inferior to umbilicus.  Hypoactive bowel sounds.  Mild RUQ tenderness. None on Left Back:  Nontender Ext:  No LE edema  Neuro:  Awake and alert.  Oriented x 4.  No focal deficits. Psych:  Not depressed or anxious appearing.  Linear and coherent thought process as evidenced by speech pattern. Smiles spontaneously.    Assessment and Plan:  1.  RLQ abdominal pain:  - Initial concern was for worsening mesenteric adenitis versus another intra-abdominal process as he did have some guarding on exam. -KUB revealed constipation throughout the entire colon. His CBC was within normal limits. His UA did show that some proteins consistent with him being a little dehydrated. -Discussed with him getting outpatient CT scan versus sending him back to the emergency department to be further evaluated. Conservative be that he would not have CT scan until tomorrow and  if he was in the emergency department they will be able to evaluate him today and well as treat him if something was found on CT. -Patient opted to wait for CT scan tomorrow consider going to the emergency department. He is eating and drinking well. He is passing flatus. He had a bowel movement yesterday. I do not think he has an acute abdomen currently. Will obtain CT with contrast to further evaluate. - Short term course of pain relievers to get him through   Addendum: -I was in with another patient when the nurse came in to talk with me about this current patient. - At this point, the encounter took  an interesting turn: the patient states that his father wanted him to delay CT scan until next week after talking with him on the phone. He asked me for more pain medicine to see if his symptoms improved over the week. - Discussed with him I would not do this as it would only mask if something else was going on.  He states his father had told him he didn't think anything was "wrong."  I offered to speak to his father.  The patient became anxious and told me he didn't think that was necessary. - At no point did I see a phone on the patient's person.  - I emphasized I would not write him more pain medicine and that he should FU with his CT scan tomorrow or go to the ED today.    Further addendum:  - I was told my Clydie BraunKaren that the patient called back and canceled his CT for tomorrow. - I am concerned now for opoiod use disorder.  I looked the patient up under the controlled substance database -- he has opioid prescriptions from multiple providers going back to at least October.   - If he returns, we should NOT fill any further opioids and discuss with him concerns about opioid misuse.    Entire visit was 45 minutes of face time

## 2016-08-02 ENCOUNTER — Ambulatory Visit (HOSPITAL_COMMUNITY): Payer: 59

## 2016-10-23 ENCOUNTER — Encounter: Payer: Self-pay | Admitting: Physician Assistant

## 2016-10-23 ENCOUNTER — Ambulatory Visit (INDEPENDENT_AMBULATORY_CARE_PROVIDER_SITE_OTHER): Payer: 59 | Admitting: Physician Assistant

## 2016-10-23 VITALS — BP 130/84 | HR 81 | Temp 98.2°F | Resp 18 | Ht 70.55 in | Wt 180.2 lb

## 2016-10-23 DIAGNOSIS — M25561 Pain in right knee: Secondary | ICD-10-CM

## 2016-10-23 DIAGNOSIS — M25562 Pain in left knee: Secondary | ICD-10-CM | POA: Diagnosis not present

## 2016-10-23 DIAGNOSIS — G8929 Other chronic pain: Secondary | ICD-10-CM

## 2016-10-23 MED ORDER — HYDROCODONE-ACETAMINOPHEN 5-325 MG PO TABS: 1.0000 | ORAL_TABLET | Freq: Four times a day (QID) | ORAL | 0 refills | Status: DC | PRN

## 2016-10-23 NOTE — Progress Notes (Signed)
Tanner Flores  MRN: [id number] DOB: 1995/07/25  PCP: Patient, No Pcp Per  Subjective:  Pt is a pleasant 21 year old male who presents to clinic for leg pain x 1 year. He is accompanied by his father.  C/o chronic b/l knee pain.   Pain is constant. Worse when standing in a certain spot for a while "feels like it wants to lock in place" Hurts worse with squatting, and feels like he cannot get back up. Knees are tender to touch. Pain occasionally radiates down towards ankles.  He worked in [employer] the past few years, recently quit due to pain.  Last year his knee developed a bruise so severe it sent him to the ED. No MOI. MRI 2017 with [facility] orthopedic was negative. He was placed in knee braces, told he was fine and to return in pain continued. He admits to "not saying anything to anybody" about his continued pain because he didn't "want to make a big deal about it".  Has worn a brace on right knee in the past - doesn't help.  He takes ibuprofen a few times a week - doesn't help much.   FHx - paternal uncle rheumatoid arthritis.   Denies hip pain, pain in other joints, red or hot joint, fever, chills, swelling.   Review of Systems  Constitutional: Negative for chills and fever.  Musculoskeletal: Positive for arthralgias, gait problem and myalgias.  Skin: Positive for color change.    Patient Active Problem List   Diagnosis Date Noted  . Acute mesenteric adenitis 07/19/2016  . Right lower quadrant abdominal pain 07/17/2016    Current Outpatient Prescriptions on File Prior to Visit  Medication Sig Dispense Refill  . HYDROcodone-acetaminophen (NORCO) 10-325 MG tablet Take 1 tablet by mouth every 8 (eight) hours as needed. (Patient not taking: Reported on 10/23/2016) 10 tablet 0   No current facility-administered medications on file prior to visit.     No Known Allergies   Objective:  BP 130/84 (BP Location: Right Arm, Patient Position: Sitting, Cuff Size: Small)    Pulse 81   Temp 98.2 F (36.8 C) (Oral)   Resp 18   Ht 5' 10.55" (1.792 m)   Wt 180 lb 3.2 oz (81.7 kg)   SpO2 99%   BMI 25.45 kg/m   Physical Exam  Constitutional: He is oriented to person, place, and time and well-developed, well-nourished, and in no distress. No distress.  Cardiovascular: Normal rate, regular rhythm and normal heart sounds.   Musculoskeletal:       Right knee: He exhibits decreased range of motion and bony tenderness. He exhibits no swelling, no effusion, no ecchymosis, no deformity, normal alignment and normal patellar mobility. Tenderness found.  Neurological: He is alert and oriented to person, place, and time. GCS score is 15.  Skin: Skin is warm and dry.  Psychiatric: Mood, memory, affect and judgment normal.  Vitals reviewed.  DG Knee complete right 06/2015: IMPRESSION: Normal joint spaces. No effusion. Degenerative change from the anterior proximal tibial apophysis. Assessment and Plan :  1. Chronic pain of both knees - Uric Acid - Rheumatoid factor - ANA w/Reflex if Positive - Sedimentation Rate - GC/Chlamydia Probe Amp - HYDROcodone-acetaminophen (NORCO) 5-325 MG tablet; Take 1 tablet by mouth every 6 (six) hours as needed.  Dispense: 30 tablet; Refill: 0 - It appears he has been seen multiple times over the past year for this complaint. Condition seems to be worsening.  Negative x-ray and MRI in 2017.  +  FHx of RA. Will draw labs today to r/o RA, gout, lupus. Consider referral to rheumatologist or possibly sports medicine. Cannot rule out deconditioning.   Marco CollieWhitney Kendrah Lovern, PA-C  Primary Care at [facility] 10/23/2016 3:36 PM

## 2016-10-23 NOTE — Patient Instructions (Addendum)
  Thank you for letting me be a part of your care team.  There are several labs we drew today - some of which will take several days to come back. I will call you with the results when they are all back. This may be the early part of next week.  There is a possibility I will need to refer you to rheumatology.  If you are on MyChart, you can send me questions from there. If not, please call or come back if there are any questions.   Thank you for coming in today. I hope you feel we met your needs.  Feel free to call [facility] if you have any questions or further requests.  Please consider signing up for MyChart if you do not already have it, as this is a great way to communicate with me.  Best,  Whitney McVey, PA-C  IF you received an x-ray today, you will receive an invoice from [facility]. Please contact [facility] at [phone] with questions or concerns regarding your invoice.   IF you received labwork today, you will receive an invoice from [place]. Please contact [facility] at [phone] with questions or concerns regarding your invoice.   Our billing staff will not be able to assist you with questions regarding bills from these companies.  You will be contacted with the lab results as soon as they are available. The fastest way to get your results is to activate your My Chart account. Instructions are located on the last page of this paperwork. If you have not heard from [place] regarding the results in 2 weeks, please contact this office.

## 2016-10-24 LAB — SEDIMENTATION RATE: Sed Rate: 2 mm/hr (ref 0–15)

## 2016-10-24 LAB — GC/CHLAMYDIA PROBE AMP
Chlamydia trachomatis, NAA: NEGATIVE
Neisseria gonorrhoeae by PCR: NEGATIVE

## 2016-10-24 LAB — URIC ACID: Uric Acid: 5 mg/dL (ref 3.7–8.6)

## 2016-10-24 LAB — ANA W/REFLEX IF POSITIVE: Anti Nuclear Antibody(ANA): NEGATIVE

## 2016-10-24 LAB — RHEUMATOID FACTOR: Rheumatoid fact SerPl-aCnc: <10 [IU]/mL (ref 0.0–13.9)

## 2016-10-29 ENCOUNTER — Telehealth: Payer: Self-pay | Admitting: Physician Assistant

## 2016-10-29 ENCOUNTER — Other Ambulatory Visit: Payer: Self-pay | Admitting: Physician Assistant

## 2016-10-29 DIAGNOSIS — G8929 Other chronic pain: Secondary | ICD-10-CM

## 2016-10-29 DIAGNOSIS — M25562 Pain in left knee: Principal | ICD-10-CM

## 2016-10-29 DIAGNOSIS — M25561 Pain in right knee: Secondary | ICD-10-CM

## 2016-10-29 NOTE — Telephone Encounter (Signed)
Pt came in today to see if he could get a refill of his hydrocodone medication.  Last O/V was 10/23/16 with [facility]  [phone]

## 2016-10-29 NOTE — Telephone Encounter (Signed)
10/23/16 last refill

## 2016-10-29 NOTE — Progress Notes (Signed)
Chronic and worsening b/l knee pain. Pt is negative for RA, lupus, sed rate, GC/chlamydia. Will refer to sports medicine.

## 2016-10-30 ENCOUNTER — Ambulatory Visit: Payer: 59 | Admitting: Physician Assistant

## 2016-10-31 NOTE — Progress Notes (Signed)
Called and left Vm with results. Plan to refer to sports medicine.

## 2016-11-01 ENCOUNTER — Other Ambulatory Visit: Payer: Self-pay | Admitting: Physician Assistant

## 2016-11-01 ENCOUNTER — Telehealth: Payer: Self-pay | Admitting: General Practice

## 2016-11-01 DIAGNOSIS — G8929 Other chronic pain: Secondary | ICD-10-CM

## 2016-11-01 DIAGNOSIS — M25562 Pain in left knee: Principal | ICD-10-CM

## 2016-11-01 DIAGNOSIS — M25561 Pain in right knee: Secondary | ICD-10-CM

## 2016-11-01 MED ORDER — CAPSAICIN 0.075 % EX CREA: 1.0000 "application " | TOPICAL_CREAM | Freq: Two times a day (BID) | CUTANEOUS | 0 refills | Status: DC

## 2016-11-01 NOTE — Telephone Encounter (Signed)
10/23/16 last refill See last note

## 2016-11-01 NOTE — Telephone Encounter (Signed)
Pt is checking on a refill on hydrocodone request   Best number [phone]

## 2016-11-01 NOTE — Addendum Note (Signed)
Addendum  created 11/01/16 1129 by Sharee HolsterMassagee, Elasia Furnish, MD   Sign clinical note

## 2016-11-05 ENCOUNTER — Telehealth: Payer: Self-pay | Admitting: Physician Assistant

## 2016-11-05 ENCOUNTER — Telehealth: Payer: Self-pay | Admitting: Family Medicine

## 2016-11-05 NOTE — Telephone Encounter (Signed)
PT CALLING ABOUT HIS REFERRAL

## 2016-11-05 NOTE — Telephone Encounter (Signed)
Pt calling to ley McVey know that his leg is flairing up and swollen red (warm to the touch) and bruised  PLEASE RESPOND

## 2016-11-05 NOTE — Telephone Encounter (Signed)
Please check status of sports med referral

## 2016-11-05 NOTE — Telephone Encounter (Signed)
Please call pt and have him come in and be seen.

## 2016-11-05 NOTE — Telephone Encounter (Signed)
Pt referral [facility] to [facility] [place]

## 2016-11-05 NOTE — Telephone Encounter (Signed)
THIS MESSAGE IS FROM PATIENT'S FATHER (WAYNE Todorov) FOR [facility]: HE SAID HIS SON SAW WHITNEY A COUPLE OF WEEKS AGO FOR PAIN HE HAS HAD IN HIS KNEE ON AND OFF FOR ABOUT A YEAR. HE SAID THIS MORNING (11/05/16) HIS KNEE REALLY STARTED HURTING AGAIN. HE WOULD LIKE TO TALK WITH WH[facility]NEY AS SOON AS POSSIBLE. (I TOLD MR. WAYNE Salminen THAT HE IS NOT ON HIS SON'S 2018 HIPAA). HE SAID HE WOULD BRING IN HIS POWER OF ATTORNEY PAPERS HERE TO BE PUT ON FILE. BEST PHONE [phone] [facility]) MBC

## 2016-11-05 NOTE — Telephone Encounter (Signed)
I see next step is rheumatology?

## 2016-11-05 NOTE — Telephone Encounter (Signed)
Please call pt in the morning and make sure he has appt to come in. Thank you!

## 2016-11-07 ENCOUNTER — Encounter: Payer: Self-pay | Admitting: Physician Assistant

## 2016-11-07 ENCOUNTER — Ambulatory Visit (INDEPENDENT_AMBULATORY_CARE_PROVIDER_SITE_OTHER): Payer: 59

## 2016-11-07 ENCOUNTER — Ambulatory Visit (INDEPENDENT_AMBULATORY_CARE_PROVIDER_SITE_OTHER): Payer: 59 | Admitting: Physician Assistant

## 2016-11-07 VITALS — BP 133/74 | HR 70 | Temp 98.7°F | Resp 18 | Ht 70.87 in | Wt 181.1 lb

## 2016-11-07 DIAGNOSIS — R58 Hemorrhage, not elsewhere classified: Secondary | ICD-10-CM

## 2016-11-07 DIAGNOSIS — M25469 Effusion, unspecified knee: Secondary | ICD-10-CM

## 2016-11-07 DIAGNOSIS — M25561 Pain in right knee: Secondary | ICD-10-CM

## 2016-11-07 DIAGNOSIS — L539 Erythematous condition, unspecified: Secondary | ICD-10-CM

## 2016-11-07 LAB — POCT CBC
GRANULOCYTE PERCENT: 50.2 % (ref 37–80)
HCT, POC: 41.8 % — AB (ref 43.5–53.7)
HEMOGLOBIN: 14.4 g/dL (ref 14.1–18.1)
Lymph, poc: 1.9 (ref 0.6–3.4)
MCH: 30.5 pg (ref 27–31.2)
MCHC: 34.4 g/dL (ref 31.8–35.4)
MCV: 88.6 fL (ref 80–97)
MID (CBC): 0.4 (ref 0–0.9)
MPV: 8.8 fL (ref 0–99.8)
POC Granulocyte: 2.3 (ref 2–6.9)
POC LYMPH PERCENT: 41.6 %L (ref 10–50)
POC MID %: 8.2 %M (ref 0–12)
Platelet Count, POC: 213 10*3/uL (ref 142–424)
RBC: 4.71 M/uL (ref 4.69–6.13)
RDW, POC: 13.4 %
WBC: 4.5 10*3/uL — AB (ref 4.6–10.2)

## 2016-11-07 LAB — POCT URINALYSIS DIP (MANUAL ENTRY)
BILIRUBIN UA: NEGATIVE
GLUCOSE UA: NEGATIVE mg/dL
Ketones, POC UA: NEGATIVE mg/dL
Leukocytes, UA: NEGATIVE
NITRITE UA: NEGATIVE
PH UA: 6 (ref 5.0–8.0)
Protein Ur, POC: NEGATIVE mg/dL
RBC UA: NEGATIVE
SPEC GRAV UA: 1.02 (ref 1.010–1.025)
UROBILINOGEN UA: 0.2 U/dL

## 2016-11-07 MED ORDER — NAPROXEN 500 MG PO TABS: 500.0000 mg | ORAL_TABLET | Freq: Two times a day (BID) | ORAL | 0 refills | Status: DC

## 2016-11-07 MED ORDER — HYDROCODONE-ACETAMINOPHEN 5-325 MG PO TABS: ORAL_TABLET | ORAL | 0 refills | Status: DC

## 2016-11-07 MED ORDER — CEPHALEXIN 500 MG PO CAPS: 500.0000 mg | ORAL_CAPSULE | Freq: Three times a day (TID) | ORAL | 0 refills | Status: DC

## 2016-11-07 NOTE — Progress Notes (Addendum)
MRN: [id number] DOB: [birth year]  Subjective:   Tanner Flores is a 21 y.o. male presenting for follow up on right knee pain x 3 days. Has associated redness, swelling, bruising, and decreased ROM. Denies acute injury, numbness, tingling, weakness, fever, chills, diaphoresis, nausea, and vomiting.Has chronic right knee pain x 1 year after an initial event that caused his right knee to pop. Since then he will have "flare ups" where his knee will lock up and then he will have immediate redness and pain, and then eventual bruising. He was seen by both the ED and ortho during a flare up. CT and MRI in the past have been negative. He has had three similar events since his first flare. The events typically take 2-3 weeks to resolve. Saw PA McVey on 10/23/16 during a non flare up period. Lab work (GC/Chlamydia, sed rate, ANA, RF, uric acid, CBC) obtained, all of which were normal. Has appointment with sports medicine on 11/13/16 but 3 days ago he had a flare and his ather wanted him to come in and get evaluated to get pain medication. Notes norco is the only thing that helps. He has tried ibuprofen and tylenol with no full relief. He has been wearing a knee immobilizer for the past 3 days which has helped.   [name] has a current medication list which includes the following prescription(s): capsicum, cephalexin, hydrocodone-acetaminophen, hydrocodone-acetaminophen, and naproxen. Also has No Known Allergies.  Tanner Flores  has no past medical history on file. Also  has a past surgical history that includes laparoscopic appendectomy (N/A, 07/18/2016).   Objective:   Vitals: BP 133/74 (BP Location: Right Arm, Patient Position: Sitting, Cuff Size: Small)   Pulse 70   Temp 98.7 F (37.1 C) (Oral)   Resp 18   Ht 5' 10.87" (1.8 m)   Wt 181 lb 1.6 oz (82.1 kg)   SpO2 98%   BMI 25.35 kg/m   Physical Exam  Constitutional: He is oriented to person, place, and time. He appears well-developed and well-nourished. He  appears distressed (in distress during knee exam due to pain).  HENT:  Head: Normocephalic and atraumatic.  Eyes: Conjunctivae are normal.  Neck: Normal range of motion.  Cardiovascular:  Pulses:      Dorsalis pedis pulses are 2+ on the right side, and 2+ on the left side.       Posterior tibial pulses are 2+ on the right side, and 2+ on the left side.  Pulmonary/Chest: Effort normal.  Musculoskeletal:       Right knee: He exhibits decreased range of motion. Swelling:   Ecchymosis:   Tenderness:          Left knee: Normal.       Right lower leg: Normal. He exhibits no tenderness and no swelling.       Left lower leg: Normal.       Legs: Neurological: He is alert and oriented to person, place, and time.  Skin: Skin is warm and dry. Capillary refill takes less than 2 seconds. No abrasion noted.  Psychiatric: He has a normal mood and affect.  Vitals reviewed.  No results found for this or any previous visit (from the past 24 hour(s)). No results found.  Assessment and Plan :  This case was precepted with Dr. Mingo Amber, who also examined the patient.   1. Knee swelling Etiology is unclear at this time. Due to the erythema and warmth, will cover for any possible underlying bacterial etiology with keflex. Will also  obtain STAT MRI of the thigh and knee to rule out any underlying abscess, although there is no fluctuance on exam and his POCT CBC does not suggest infectious process. Will also obtain lab work to evaluate any underlying bleeding/inflammatory etiology. Urine drug screen obtained as well. While labs and MRI results are pending, pt has been given a Rx for naproxen and norco for pain. Instructed to use naproxen as prescribed and use norco for breakthrough pain. Will discuss further tx plan once results return. Given strict ED/return precautions.  - Sedimentation Rate - Protime-INR - APTT - POCT CBC - CK - CMP14+EGFR - POCT urinalysis dipstick - Pain Management Screening Profile  (10S) - C-reactive protein - DG Knee Complete 4 Views Right; Future - cephALEXin (KEFLEX) 500 MG capsule; Take 1 capsule (500 mg total) by mouth 3 (three) times daily.  Dispense: 21 capsule; Refill: 0 - MR FEMUR RIGHT W WO CONTRAST; Future - MR KNEE RIGHT W WO CONTRAST; Future  2. Acute pain of right knee Will treat acute flare of knee pain with naproxen as prescribed and norco as needed for breakthrough pain.  Discussed risks of chronic narcotic use with patient.  - naproxen (NAPROSYN) 500 MG tablet; Take 1 tablet (500 mg total) by mouth 2 (two) times daily with a meal.  Dispense: 30 tablet; Refill: 0 - HYDROcodone-acetaminophen (NORCO) 5-325 MG tablet; Take for breakthrough pain every 8 hrs as needed.  Dispense: 20 tablet; Refill: 0  Tenna Delaine, PA-C  [facility] 11/09/2016 12:35 PM

## 2016-11-07 NOTE — Telephone Encounter (Signed)
Seeing dr Alvy Bimlersagardia today

## 2016-11-07 NOTE — Patient Instructions (Addendum)
Take antibiotics as prescribed. Use naproxen every 12 hours for inflammation and pain. You can use the norco for breakthrough pain. The goal is to not have to use it more than 2 times a day while using the naproxen. You can use ice to the affected area if it helps. I will get your image scheduled today and will contact you with the appointment. Once I have those results and your lab work I will contact you for further treatment plan. If anything worsens, please seek care immediately. Thank you for letting me participate in your health and well being.     IF you received an x-ray today, you will receive an invoice from [facility]. Please contact [facility] at [phone] with questions or concerns regarding your invoice.   IF you received labwork today, you will receive an invoice from [facility]. Please contact [facility] at [phone] with questions or concerns regarding your invoice.   Our billing staff will not be able to assist you with questions regarding bills from these companies.  You will be contacted with the lab results as soon as they are available. The fastest way to get your results is to activate your My Chart account. Instructions are located on the last page of this paperwork. If you have not heard from us regarding the results in 2 weeks, please contact this office.

## 2016-11-08 LAB — CMP14+EGFR
A/G RATIO: 2 (ref 1.2–2.2)
ALT: 14 IU/L (ref 0–44)
AST: 16 IU/L (ref 0–40)
Albumin: 4.9 g/dL (ref 3.5–5.5)
Alkaline Phosphatase: 103 IU/L (ref 39–117)
BUN/Creatinine Ratio: 11 (ref 9–20)
BUN: 10 mg/dL (ref 6–20)
Bilirubin Total: 0.8 mg/dL (ref 0.0–1.2)
CALCIUM: 9.6 mg/dL (ref 8.7–10.2)
CO2: 27 mmol/L (ref 18–29)
CREATININE: 0.89 mg/dL (ref 0.76–1.27)
Chloride: 100 mmol/L (ref 96–106)
GFR calc Af Amer: 142 mL/min/{1.73_m2} (ref >59)
GFR, EST NON AFRICAN AMERICAN: 123 mL/min/{1.73_m2} (ref >59)
Globulin, Total: 2.5 g/dL (ref 1.5–4.5)
Glucose: 85 mg/dL (ref 65–99)
POTASSIUM: 5.2 mmol/L (ref 3.5–5.2)
Sodium: 143 mmol/L (ref 134–144)
Total Protein: 7.4 g/dL (ref 6.0–8.5)

## 2016-11-08 LAB — PMP SCREEN PROFILE (10S), URINE
Amphetamine Scrn, Ur: NEGATIVE ng/mL
BARBITURATE SCREEN URINE: NEGATIVE ng/mL
BENZODIAZEPINE SCREEN, URINE: NEGATIVE ng/mL
CANNABINOIDS UR QL SCN: NEGATIVE ng/mL
CREATININE(CRT), U: 148.7 mg/dL (ref 20.0–300.0)
Cocaine (Metab) Scrn, Ur: NEGATIVE ng/mL
METHADONE SCREEN, URINE: NEGATIVE ng/mL
OPIATE SCREEN URINE: POSITIVE ng/mL
OXYCODONE+OXYMORPHONE UR QL SCN: POSITIVE ng/mL
PHENCYCLIDINE QUANTITATIVE URINE: NEGATIVE ng/mL
PROPOXYPHENE SCREEN URINE: NEGATIVE ng/mL
Ph of Urine: 5.6 (ref 4.5–8.9)

## 2016-11-08 LAB — PROTIME-INR
INR: 1 (ref 0.8–1.2)
Prothrombin Time: 10.4 s (ref 9.1–12.0)

## 2016-11-08 LAB — APTT: APTT: 27 s (ref 24–33)

## 2016-11-08 LAB — CK: Total CK: 81 U/L (ref 24–204)

## 2016-11-08 LAB — SEDIMENTATION RATE: Sed Rate: 2 mm/hr (ref 0–15)

## 2016-11-08 LAB — C-REACTIVE PROTEIN: CRP: 7.7 mg/L — AB (ref 0.0–4.9)

## 2016-11-09 ENCOUNTER — Ambulatory Visit (HOSPITAL_BASED_OUTPATIENT_CLINIC_OR_DEPARTMENT_OTHER)
Admission: RE | Admit: 2016-11-09 | Discharge: 2016-11-09 | Disposition: A | Discharge status: Home / Self Care | Payer: 59 | Source: Ambulatory Visit | Attending: Physician Assistant | Admitting: Physician Assistant

## 2016-11-09 DIAGNOSIS — M25469 Effusion, unspecified knee: Secondary | ICD-10-CM

## 2016-11-09 DIAGNOSIS — M25561 Pain in right knee: Secondary | ICD-10-CM | POA: Diagnosis present

## 2016-11-09 DIAGNOSIS — M79651 Pain in right thigh: Secondary | ICD-10-CM | POA: Diagnosis not present

## 2016-11-09 DIAGNOSIS — R600 Localized edema: Secondary | ICD-10-CM | POA: Insufficient documentation

## 2016-11-09 MED ORDER — GADOBENATE DIMEGLUMINE 529 MG/ML IV SOLN: 15.0000 mL | Freq: Once | INTRAVENOUS | Status: DC | PRN

## 2016-11-12 ENCOUNTER — Telehealth: Payer: Self-pay | Admitting: Family Medicine

## 2016-11-12 DIAGNOSIS — M25561 Pain in right knee: Principal | ICD-10-CM

## 2016-11-12 DIAGNOSIS — G8929 Other chronic pain: Secondary | ICD-10-CM

## 2016-11-12 NOTE — Telephone Encounter (Signed)
Please review the results and release them, to call the patient. Thanks

## 2016-11-12 NOTE — Telephone Encounter (Signed)
[facility] CALLING ABOUT MRI OF THE KNEE

## 2016-11-13 ENCOUNTER — Encounter: Payer: Self-pay | Admitting: Sports Medicine

## 2016-11-13 ENCOUNTER — Ambulatory Visit (INDEPENDENT_AMBULATORY_CARE_PROVIDER_SITE_OTHER): Payer: 59 | Admitting: Sports Medicine

## 2016-11-13 VITALS — BP 119/81 | Ht 71.0 in | Wt 175.0 lb

## 2016-11-13 DIAGNOSIS — M7989 Other specified soft tissue disorders: Secondary | ICD-10-CM

## 2016-11-13 DIAGNOSIS — M25469 Effusion, unspecified knee: Secondary | ICD-10-CM | POA: Diagnosis not present

## 2016-11-13 DIAGNOSIS — R29898 Other symptoms and signs involving the musculoskeletal system: Secondary | ICD-10-CM | POA: Diagnosis not present

## 2016-11-13 MED ORDER — DICLOFENAC SODIUM 75 MG PO TBEC: 75.0000 mg | DELAYED_RELEASE_TABLET | Freq: Two times a day (BID) | ORAL | 0 refills | Status: DC

## 2016-11-13 NOTE — Telephone Encounter (Signed)
Called patient and discussed lab results. He saw sports med today and they are referring him to neurology and vascular surgery. States he is still having pain but the redness has resolved. There is still bruising. He asked for more norco. I asked him what the sports med gave him today and he said diclofenac. I instructed him to continue diclofenac for pain as this will likely be more beneficial for inflammation compared to norco. Pt understands and agrees to do so. I have also placed a referall for rheum for further evaluation.  I also discussed results with father and per father. He agrees with the rheum referral as well. Notes he just wants to figure out what is wrong with his son. In terms of medication, he stated  "we are going to try and stop the pain meds because I have another son who struggled with pain med addiction from an injury."

## 2016-11-13 NOTE — Telephone Encounter (Signed)
I called pt and left a voicemail stating that MRI of thigh and knee were normal, it did show subcutaneous edema, which was expected. Also discussed that all of his labs were normal except his CRP which was mildly elevated. Instructed to call our office back so we can discuss further tx plan.

## 2016-11-13 NOTE — Progress Notes (Signed)
Subjective:    Patient ID: Tanner Flores, male    DOB: September 28, 1995, 21 y.o.   MRN: [id number]  HPI chief complaint: Right knee pain and swelling  Patient is a 21 year old male that comes in today complaining of 16 months of intermittent right knee pain and swelling. The initial episode happened in February 2017. He was simply squatting down when he felt a pop in his knee. This was followed by pain and swelling along the medial aspect of his knee. He has since had 2 separate similar episodes, the most recent being earlier this month. He has been seen at multiple facilities including the emergency room, urgent care, and [facility]. He's had extensive blood work which has failed to reveal a diagnosis. He recently underwent an MRI of his right knee and thigh with and without contrast. The MRI shows no evidence of internal derangement of his knee. He does have extensive soft tissue swelling throughout the knee however. Each episode seems to start with pain, swelling, and ecchymosis along the medial knee. Pain will be present for several weeks before resolving. He describes episodes of the leg "giving way" which will cause him to fall. He also describes episodes of the knee wanting to lock up on him from time to time. He denies bruising or swelling elsewhere in his body. He presents today in a knee immobilizer. He has taken ibuprofen and hydrocodone for his pain. He is here today with his father.  Past medical history is reviewed Medications reviewed Allergies reviewed    Review of Systems    as above Objective:   Physical Exam  Well-developed, well-nourished. No acute distress. Sitting comfortably in the exam room  Examination of the right knee is limited due to patient guarding and pain. There is extensive ecchymosis along the medial thigh, knee, and calf. He is tender to palpation along this area as well. No erythema. Area is not warm to touch. No joint effusion. Knee is grossly  stable to ligamentous exam. Neurovascularly intact distally.  MRI of the right knee and thigh are as above      Assessment & Plan:   Right knee pain and ecchymosis of unknown etiology  Other than soft tissue swelling, the MRI of his knee and thigh are unremarkable. Certainly no evidence of internal derangement of the right knee. No effusion. I reviewed all of his blood work from the past year including a complete rheumatological panel, PT,aPTT, INR, and CBC. Cannot find any hematologic reason for his bruising and swelling. It seems to be somehow vascular given the bruising that is present. Although the MRI did not show any vascular abnormalities, I would like for him to be evaluated by vascular surgery just to ensure that I am not missing anything here. His episodes of giving way are certainly not related to his knee but could theoretically be due to some sort of neurological cause. I would like for him to see neurology and defer further neurological workup to them. Prior to leaving the office, the patient was asking about possibly getting more pain medication. I explained to him that we do not provide her chronic medication through our office but we will try him on diclofenac 75 mg twice daily as needed for pain and swelling. He will discontinue his knee immobilizer in favor of a simple Ace wrap. He will continue to ambulate with crutches as needed. He was initially placed on antibiotics but has since discontinued those himself. This does not appear to be infectious.  For now, I will await the input of both neurology and vascular.

## 2016-11-13 NOTE — Patient Instructions (Addendum)
[facility] [address], [place], [place] [address] Phone: [phone]    [facility] [address], [place], [place] [address] Phone: [phone]

## 2016-11-14 ENCOUNTER — Other Ambulatory Visit: Payer: Self-pay

## 2016-11-14 DIAGNOSIS — M79651 Pain in right thigh: Secondary | ICD-10-CM

## 2016-11-14 DIAGNOSIS — M25561 Pain in right knee: Secondary | ICD-10-CM

## 2017-01-02 ENCOUNTER — Ambulatory Visit (INDEPENDENT_AMBULATORY_CARE_PROVIDER_SITE_OTHER): Payer: 59 | Admitting: Neurology

## 2017-01-02 ENCOUNTER — Encounter: Payer: Self-pay | Admitting: Neurology

## 2017-01-02 DIAGNOSIS — M25561 Pain in right knee: Secondary | ICD-10-CM

## 2017-01-02 HISTORY — DX: Pain in right knee: M25.561

## 2017-01-02 NOTE — Progress Notes (Signed)
Reason for visit: Right knee pain  Referring physician: Dr. Laverda Pageraper  Tanner Flores is a 21 y.o. male  History of present illness:  Tanner Flores is a 21 year old right-handed white male with a history of 3 separate episodes of redness, swelling, and bruising around the medial aspect of the right knee. The first event occurred in February 2017, another event that was less severe was in August 2017, and a third event occurred in June 2018. All 3 events were in the same area on the medial aspect of the right knee, the patient claims that he is normal between the events, and then suddenly he will collapse with the right knee and then he is left with bruising and pain that may last several weeks to get over. He claims that he does not collapse in the knee because of pain. He has undergone orthopedic evaluations, MRI of the knee has not shown any intrinsic joint problems. He has been seen through rheumatology, blood work testing has been done and is unremarkable. The patient claims that he still having pain in the knee, and it is painful to bend the knee, but he is able to walk on the leg with the knee extended. He denies any back pain or pain down the leg, he denies any numbness of the arms or legs or true weakness of the arms or legs. He denies issues controlling the bowels or the bladder. He is sent to this office for an evaluation.  History reviewed. No pertinent past medical history.  Past Surgical History:  Procedure Laterality Date  . LAPAROSCOPIC APPENDECTOMY N/A 07/18/2016   Procedure: APPENDECTOMY LAPAROSCOPIC;  Surgeon: Avel Peaceodd Rosenbower, MD;  Location: [facility];  Service: General;  Laterality: N/A;  . WISDOM TOOTH EXTRACTION  04/2016   x4    Family History  Problem Relation Age of Onset  . Cancer Maternal Grandmother   . Diabetes Maternal Grandmother   . Heart disease Maternal Grandfather   . Diabetes Paternal Grandmother     Social history:  reports that he has never smoked. He  has never used smokeless tobacco. He reports that he does not drink alcohol or use drugs.  Medications:  Prior to Admission medications   Not on File     No Known Allergies  ROS:  Out of a complete 14 system review of symptoms, the patient complains only of the following symptoms, and all other reviewed systems are negative.  Insomnia Joint pain Not enough sleep  Blood pressure 121/67, pulse (!) 51, height 5\' 11"  (1.803 m), weight 184 lb 4 oz (83.6 kg).  Physical Exam  General: The patient is alert and cooperative at the time of the examination.  Eyes: Pupils are equal, round, and reactive to light. Discs are flat bilaterally.  Neck: The neck is supple, no carotid bruits are noted.  Respiratory: The respiratory examination is clear.  Cardiovascular: The cardiovascular examination reveals a regular rate and rhythm, no obvious murmurs or rubs are noted.  Neuromuscular: The right knee appears to be without swelling, fluid, or bruising.  Skin: Extremities are without significant edema.  Neurologic Exam  Mental status: The patient is alert and oriented x 3 at the time of the examination. The patient has apparent normal recent and remote memory, with an apparently normal attention span and concentration ability.  Cranial nerves: Facial symmetry is present. There is good sensation of the face to pinprick and soft touch bilaterally. The strength of the facial muscles and the muscles to head  turning and shoulder shrug are normal bilaterally. Speech is well enunciated, no aphasia or dysarthria is noted. Extraocular movements are full. Visual fields are full. The tongue is midline, and the patient has symmetric elevation of the soft palate. No obvious hearing deficits are noted.  Motor: The motor testing reveals 5 over 5 strength of all 4 extremities. Good symmetric motor tone is noted throughout.  Sensory: Sensory testing is intact to pinprick, soft touch, vibration sensation, and  position sense on all 4 extremities. No evidence of extinction is noted.  Coordination: Cerebellar testing reveals good finger-nose-finger and heel-to-shin bilaterally, the patient has difficulty performing this task with the right leg. The patient indicates that it hurts to bend the knee, but with slow passive flexion of the knee, he is able to sit with the knee fully flexed without pain.  Gait and station: Gait is associated with a stiff legged gait with the right leg extended. Tandem gait is normal. Romberg is negative. No drift is seen.  Reflexes: Deep tendon reflexes are symmetric and normal bilaterally. Toes are downgoing bilaterally.   Assessment/Plan:  1. Episodic right knee pain and swelling and bruising  I cannot think of any primary neurologic problem that would cause the symptoms described by this patient. The father is with the patient today and provides photographs of the right knee during the episodes of swelling and pain. The photographs appear to show bruising and tissue injury to the skin and muscle around the medial aspect of the right knee. This is most consistent with direct trauma. My highest clinical suspicion in this case would be that this is a self-induced issue with physical trauma to the knee. There does not appear to be any intrinsic knee joint disease. I would not recommend any further neurologic evaluation. On the clinical examination today, there is no residual bruising, swelling or redness around the knee, MRI evaluation has shown normal intra-articuar structures, yet the patient is still indicating pain with flection of the knee greater than 30 degrees. I highly suspect a nonorganic process in this case.  Marlan Palau. Keith Willis MD 01/02/2017 3:09 PM  [facility] [address] [place], [place] [address]  Phone [phone] Fax [phone]

## 2017-01-07 ENCOUNTER — Encounter: Payer: Self-pay | Admitting: Vascular Surgery

## 2017-01-15 ENCOUNTER — Ambulatory Visit (INDEPENDENT_AMBULATORY_CARE_PROVIDER_SITE_OTHER): Payer: 59 | Admitting: Vascular Surgery

## 2017-01-15 ENCOUNTER — Encounter: Payer: Self-pay | Admitting: Vascular Surgery

## 2017-01-15 ENCOUNTER — Ambulatory Visit (HOSPITAL_COMMUNITY)
Admission: RE | Admit: 2017-01-15 | Discharge: 2017-01-15 | Disposition: A | Discharge status: Home / Self Care | Payer: 59 | Source: Ambulatory Visit | Attending: Vascular Surgery | Admitting: Vascular Surgery

## 2017-01-15 VITALS — BP 131/85 | HR 75 | Temp 98.6°F | Resp 16 | Ht 70.0 in

## 2017-01-15 DIAGNOSIS — M25561 Pain in right knee: Secondary | ICD-10-CM

## 2017-01-15 DIAGNOSIS — M79651 Pain in right thigh: Secondary | ICD-10-CM | POA: Diagnosis not present

## 2017-01-15 LAB — VAS US LOWER EXTREMITY ARTERIAL DUPLEX
RIGHT ANT DIST TIBAL SYS PSV: 74 cm/s
RSFMPSV: 111 cm/s
RTIBDISTSYS: 77 cm/s
Right peroneal sys PSV: 55 cm/s
Right super femoral dist sys PSV: -73 cm/s
Right super femoral prox sys PSV: 73 cm/s

## 2017-01-15 NOTE — Progress Notes (Signed)
Requested by:  Dr. Margaretha Sheffieldraper  Reason for consultation: right knee pain and swelling    History of Present Illness   Tanner Flores is a 21 y.o. (April 06, 1996) male who presents with cc: intermittent right knee pain and swelling.  Pt notes onset over one year ago of right thigh and knee pain associated with swelling and echymosis.  No mechanism of injury was noted.  The patient has had another two episodes since then of lesser degree.  Reported the patient still has some mild pain with certain angulation of the right knee.  He has been evaluated exhaustively with Ortho, Neuro, and Rheumatology.  The patient denies any intermittent claudication or rest pain.  He denies any varicose veins.   Past Medical History:  Diagnosis Date  . Right knee pain 01/02/2017    Past Surgical History:  Procedure Laterality Date  . LAPAROSCOPIC APPENDECTOMY N/A 07/18/2016   Procedure: APPENDECTOMY LAPAROSCOPIC;  Surgeon: Avel Peaceodd Rosenbower, MD;  Location: [facility];  Service: General;  Laterality: N/A;  . WISDOM TOOTH EXTRACTION  04/2016   x4     Social History   Social History  . Marital status: Single    Spouse name: N/A  . Number of children: N/A  . Years of education: 4512   Occupational History  . Not on file.   Social History Main Topics  . Smoking status: Never Smoker  . Smokeless tobacco: Never Used  . Alcohol use No  . Drug use: No  . Sexual activity: Not on file   Other Topics Concern  . Not on file   Social History Narrative   Lives with parents   Caffeine use: 3 sodas per day    Family History  Problem Relation Age of Onset  . Cancer Maternal Grandmother   . Diabetes Maternal Grandmother   . Heart disease Maternal Grandfather   . Diabetes Paternal Grandmother     No current outpatient prescriptions on file.   No current facility-administered medications for this visit.     No Known Allergies  REVIEW OF SYSTEMS (negative unless checked):   Cardiac:  []  Chest pain or  chest pressure? []  Shortness of breath upon activity? []  Shortness of breath when lying flat? []  Irregular heart rhythm?  Vascular:  []  Pain in calf, thigh, or hip brought on by walking? []  Pain in feet at night that wakes you up from your sleep? []  Blood clot in your veins? [x]  Leg swelling?  Pulmonary:  []  Oxygen at home? []  Productive cough? []  Wheezing?  Neurologic:  []  Sudden weakness in arms or legs? []  Sudden numbness in arms or legs? []  Sudden onset of difficult speaking or slurred speech? []  Temporary loss of vision in one eye? []  Problems with dizziness?  Gastrointestinal:  []  Blood in stool? []  Vomited blood?  Genitourinary:  []  Burning when urinating? []  Blood in urine?  Psychiatric:  []  Major depression  Hematologic:  []  Bleeding problems? []  Problems with blood clotting?  Dermatologic:  []  Rashes or ulcers?  Constitutional:  []  Fever or chills?  Ear/Nose/Throat:  []  Change in hearing? []  Nose bleeds? []  Sore throat?  Musculoskeletal:  []  Back pain? [x]  Joint pain? [x]  Muscle pain?   Physical Examination     Vitals:   01/15/17 1220  BP: 131/85  Pulse: 75  Resp: 16  Temp: 98.6 F (37 C)  TempSrc: Oral  SpO2: 97%  Height: 5\' 10"  (1.778 m)   There is no height or weight on file to  calculate BMI.  General Alert, O x 3, WD, NAD  Head [place]/AT,    Ear/Nose/ Throat Hearing grossly intact, nares without erythema or drainage, oropharynx without Erythema or Exudate, Mallampati score: 3,   Eyes PERRLA, EOMI,    Neck Supple, mid-line trachea,    Pulmonary Sym exp, good B air movt, CTA B  Cardiac RRR, Nl S1, S2, no Murmurs, No rubs, No S3,S4  Vascular Vessel Right Left  Radial Palpable Palpable  Brachial Palpable Palpable  Carotid Palpable, No Bruit Palpable, No Bruit  Aorta Not palpable N/A  Femoral Palpable Palpable  Popliteal Not palpable Not palpable  PT Palpable Palpable  DP Palpable Palpable    Gastro- intestinal soft,  non-distended, non-tender to palpation, No guarding or rebound, no HSM, no masses, no CVAT B, No palpable prominent aortic pulse,    Musculo- skeletal M/S 5/5 throughout  , Extremities without ischemic changes  , No edema present, No visible varicosities , No Lipodermatosclerosis present  Neurologic Cranial nerves 2-12 intact , Pain and light touch intact in extremities , Motor exam as listed above  Psychiatric Judgement intact, Mood & affect appropriate for pt's clinical situation  Dermatologic See M/S exam for extremity exam, No rashes otherwise noted  Lymphatic  Palpable lymph nodes: None    Non-invasive Vascular Imaging     RLE arterial duplex (01/15/2017):  Triphasic throughout without any evidence of hemodynamically significant stenosis, no changes with provocative testing   Radiology     MRI R thigh (11/09/16):  Negative MRI of the right thigh.  MRI R knee (11/09/16):  Subcutaneous soft tissue edema or cellulitis.  Unremarkable MRI of the knee otherwise.  I reviewed both the MRI of R femur and R knee, there do not appear to be any obvious arterial malpositioning or inflammation.  I also don't see any AVM   Outside Studies/Documentation   10 pages of outside documents were reviewed including: PCP and outside consultant records.   Medical Decision Making   Tanner Flores is a 21 y.o. male who presents with: right knee pain and swelling   The images shared by the patient's father suggestive some type of inflammatory process with some degree of bleeding.  His physical exam is not consistent with lymphatic etiology or arterial etiology.  In this age group, there are only a few diagnoses that we encounter, all rare.  Will proceed first with evocative testing on R leg to try to see if he has any evidence of popliteal artery entrapment. (Testing was negative)  If the duplex suggest such, would proceed with definitive testing with angiography.  If the duplex is  non-diagnostic, would proceed with venous insufficiency test.  In the process, evaluation for any arteriovenous malformation would be covered.  Follow up in 4 weeks for RLE venous insufficiency duplex.  Thank you for allowing [place] to participate in this patient's care.   Leonides Sake, MD, FACS [facility] Office: [phone] Pager: [phone]  01/15/2017, 12:17 PM

## 2017-01-16 NOTE — Addendum Note (Signed)
Addended by: Burton ApleyPETTY, [place] A on: 01/16/2017 08:38 AM   Modules accepted: Orders

## 2017-02-21 ENCOUNTER — Ambulatory Visit (INDEPENDENT_AMBULATORY_CARE_PROVIDER_SITE_OTHER): Payer: 59 | Admitting: Vascular Surgery

## 2017-02-21 ENCOUNTER — Encounter: Payer: Self-pay | Admitting: Vascular Surgery

## 2017-02-21 ENCOUNTER — Ambulatory Visit (HOSPITAL_COMMUNITY)
Admission: RE | Admit: 2017-02-21 | Discharge: 2017-02-21 | Disposition: A | Discharge status: Home / Self Care | Payer: 59 | Source: Ambulatory Visit | Attending: Vascular Surgery | Admitting: Vascular Surgery

## 2017-02-21 VITALS — BP 122/73 | HR 58 | Temp 98.5°F | Resp 20 | Ht 70.0 in | Wt 186.0 lb

## 2017-02-21 DIAGNOSIS — I872 Venous insufficiency (chronic) (peripheral): Secondary | ICD-10-CM

## 2017-02-21 DIAGNOSIS — M25561 Pain in right knee: Secondary | ICD-10-CM | POA: Diagnosis not present

## 2017-02-21 NOTE — Progress Notes (Signed)
    Established Venous Insufficiency   History of Present Illness   Tanner Flores is a 21 y.o. ([birth year]) male who presents with chief complaint: none.  This patient presents with one year history of right thigh and knee pain associated with swelling and echymosis.  Arterial work-up to date was negative.  Pt returns for venous work-up today.  The patient most recent sx are: none.  He has not had a recent episode.  The patient's PMH, PSH, SH, and FamHx are unchanged from 01/15/17.  Medications: none  No Known Allergies  On ROS today: no pain, no swelling, no echymosis   Physical Examination   Vitals:   02/21/17 1612  BP: 122/73  Pulse: (!) 58  Resp: 20  Temp: 98.5 F (36.9 C)  SpO2: 96%  Weight: 186 lb (84.4 kg)  Height:  (1.778 m)   Body mass index is 26.69 kg/m.  General Alert, O x 3, WD, NAD  Pulmonary Sym exp, good B air movt, CTA B  Cardiac RRR, Nl S1, S2, no Murmurs, No rubs, No S3,S4  Vascular Vessel Right Left  Radial Palpable Palpable  Brachial Palpable Palpable  Carotid Palpable, No Bruit Palpable, No Bruit  Aorta Not palpable N/A  Femoral Palpable Palpable  Popliteal Not palpable Not palpable  PT Palpable Palpable  DP Palpable Palpable    Gastro- intestinal soft, non-distended, non-tender to palpation, No guarding or rebound, no HSM, no masses, no CVAT B, No palpable prominent aortic pulse,    Musculo- skeletal M/S 5/5 throughout  , Extremities without ischemic changes  , No edema present, No visible varicosities , No Lipodermatosclerosis present  Neurologic Cranial nerves 2-12 intact , Pain and light touch intact in extremities , Motor exam as listed above     Non-Invasive Vascular Imaging   RLE Venous Insufficiency Duplex (02/21/2017):   RLE:   no DVT and SVT,   focal GSV reflux in distal thigh extending into calf  no SSV reflux,  no deep venous reflux   Medical Decision Making   Tanner Flores is a 21 y.o. male who presents  with: right knee pain and swelling, limited chronic venous insufficiency in R leg   I doubt this patient's CVI is the cause of his sx.  It is unclear to me if this patient's episodes are truly vascular in etiology given the normal studies on the arterial system and limited findings in the venous system.  Thank you for allowing [place] to participate in this patient's care.   Leonides Sake, MD, FACS [facility] [employer] [facility] Office: [phone] Pager: [phone]

## 2017-05-24 ENCOUNTER — Emergency Department (HOSPITAL_COMMUNITY): Payer: 59

## 2017-05-24 ENCOUNTER — Encounter (HOSPITAL_COMMUNITY): Payer: Self-pay | Admitting: Emergency Medicine

## 2017-05-24 ENCOUNTER — Other Ambulatory Visit: Payer: Self-pay

## 2017-05-24 ENCOUNTER — Emergency Department (HOSPITAL_COMMUNITY)
Admission: EM | Admit: 2017-05-24 | Discharge: 2017-05-24 | Disposition: A | Discharge status: Home / Self Care | Payer: 59 | Attending: Emergency Medicine | Admitting: Emergency Medicine

## 2017-05-24 ENCOUNTER — Ambulatory Visit (HOSPITAL_COMMUNITY)
Admission: EM | Admit: 2017-05-24 | Discharge: 2017-05-24 | Discharge status: Against Medical Advice | Payer: 59 | Source: Home / Self Care

## 2017-05-24 DIAGNOSIS — R1013 Epigastric pain: Secondary | ICD-10-CM | POA: Insufficient documentation

## 2017-05-24 DIAGNOSIS — R112 Nausea with vomiting, unspecified: Secondary | ICD-10-CM | POA: Diagnosis not present

## 2017-05-24 DIAGNOSIS — R1011 Right upper quadrant pain: Secondary | ICD-10-CM | POA: Diagnosis present

## 2017-05-24 DIAGNOSIS — K920 Hematemesis: Secondary | ICD-10-CM | POA: Insufficient documentation

## 2017-05-24 LAB — COMPREHENSIVE METABOLIC PANEL
ALBUMIN: 4.8 g/dL (ref 3.5–5.0)
ALK PHOS: 96 U/L (ref 38–126)
ALT: 18 U/L (ref 17–63)
ANION GAP: 9 (ref 5–15)
AST: 22 U/L (ref 15–41)
BUN: 9 mg/dL (ref 6–20)
CALCIUM: 9.7 mg/dL (ref 8.9–10.3)
CHLORIDE: 101 mmol/L (ref 101–111)
CO2: 28 mmol/L (ref 22–32)
CREATININE: 0.98 mg/dL (ref 0.61–1.24)
GFR calc Af Amer: >60 mL/min (ref >60)
GFR calc non Af Amer: >60 mL/min (ref >60)
GLUCOSE: 105 mg/dL — AB (ref 65–99)
Potassium: 4.7 mmol/L (ref 3.5–5.1)
SODIUM: 138 mmol/L (ref 135–145)
Total Bilirubin: 1.2 mg/dL (ref 0.3–1.2)
Total Protein: 7.3 g/dL (ref 6.5–8.1)

## 2017-05-24 LAB — URINALYSIS, ROUTINE W REFLEX MICROSCOPIC
BILIRUBIN URINE: NEGATIVE
GLUCOSE, UA: NEGATIVE mg/dL
HGB URINE DIPSTICK: NEGATIVE
Ketones, ur: NEGATIVE mg/dL
Leukocytes, UA: NEGATIVE
Nitrite: NEGATIVE
Protein, ur: NEGATIVE mg/dL
SPECIFIC GRAVITY, URINE: 1.019 (ref 1.005–1.030)
pH: 5 (ref 5.0–8.0)

## 2017-05-24 LAB — CBC
HCT: 44.4 % (ref 39.0–52.0)
HEMOGLOBIN: 15.2 g/dL (ref 13.0–17.0)
MCH: 30.5 pg (ref 26.0–34.0)
MCHC: 34.2 g/dL (ref 30.0–36.0)
MCV: 89.2 fL (ref 78.0–100.0)
Platelets: 252 10*3/uL (ref 150–400)
RBC: 4.98 MIL/uL (ref 4.22–5.81)
RDW: 13 % (ref 11.5–15.5)
WBC: 5.3 10*3/uL (ref 4.0–10.5)

## 2017-05-24 LAB — LIPASE, BLOOD: LIPASE: 20 U/L (ref 11–51)

## 2017-05-24 MED ORDER — MORPHINE SULFATE (PF) 4 MG/ML IV SOLN
4.0000 mg | Freq: Once | INTRAVENOUS | Status: AC
  Administered 2017-05-24: 4 mg via INTRAVENOUS
  Filled 2017-05-24: qty 1

## 2017-05-24 MED ORDER — ONDANSETRON HCL 4 MG PO TABS: 4.0000 mg | ORAL_TABLET | Freq: Three times a day (TID) | ORAL | 0 refills | Status: DC | PRN

## 2017-05-24 MED ORDER — ONDANSETRON HCL 4 MG/2ML IJ SOLN
4.0000 mg | Freq: Once | INTRAMUSCULAR | Status: AC
  Administered 2017-05-24: 4 mg via INTRAVENOUS
  Filled 2017-05-24: qty 2

## 2017-05-24 MED ORDER — SODIUM CHLORIDE 0.9 % IV BOLUS (SEPSIS)
1000.0000 mL | Freq: Once | INTRAVENOUS | Status: AC
  Administered 2017-05-24: 1000 mL via INTRAVENOUS

## 2017-05-24 MED ORDER — OXYCODONE-ACETAMINOPHEN 5-325 MG PO TABS: 1.0000 | ORAL_TABLET | ORAL | 0 refills | Status: DC | PRN

## 2017-05-24 MED ORDER — GI COCKTAIL ~~LOC~~
30.0000 mL | Freq: Once | ORAL | Status: AC
  Administered 2017-05-24: 30 mL via ORAL
  Filled 2017-05-24: qty 30

## 2017-05-24 MED ORDER — OMEPRAZOLE 20 MG PO CPDR: 20.0000 mg | DELAYED_RELEASE_CAPSULE | Freq: Every day | ORAL | 0 refills | Status: DC

## 2017-05-24 MED ORDER — IOPAMIDOL (ISOVUE-300) INJECTION 61%
INTRAVENOUS | Status: AC
  Administered 2017-05-24: 100 mL
  Filled 2017-05-24: qty 100

## 2017-05-24 NOTE — ED Triage Notes (Signed)
Pt. Stated, I've had stomach pain with N/V started on Thuirsday

## 2017-05-24 NOTE — Discharge Instructions (Signed)
Your workup today showed no surgical cause of your abdominal pain.  Your liver function was normal and your ultrasound and CT did not show evidence of problems with your gallbladder and liver.  We did not find evidence of obstruction.  We suspect he may have an ulcer given your ibuprofen use.  Please follow-up with the gastroenterology team for further management.  Please use the pain, nausea, and stomach acid medicine to help with your symptoms.  If any symptoms change or worsen, please return to the nearest emergency department.

## 2017-05-24 NOTE — ED Provider Notes (Signed)
[facility] Provider Note   CSN: [id number] Arrival date & time: 05/24/17  1348     History   Chief Complaint Chief Complaint  Patient presents with  . Abdominal Pain  . Emesis  . Nausea    HPI Tanner Flores is a 21 y.o. male.  The history is provided by the patient and medical records. No language interpreter was used.  Abdominal Pain   This is a new problem. The current episode started 2 days ago. The problem occurs constantly. The problem has not changed since onset.The pain is associated with an unknown factor. The pain is located in the RUQ and epigastric region. The quality of the pain is aching and cramping. The pain is at a severity of 10/10. The pain is severe. Associated symptoms include nausea and vomiting. Pertinent negatives include fever, diarrhea, constipation, dysuria, frequency, hematuria and headaches. The symptoms are aggravated by palpation.    Past Medical History:  Diagnosis Date  . Right knee pain 01/02/2017    Patient Active Problem List   Diagnosis Date Noted  . Right knee pain 01/02/2017  . Acute mesenteric adenitis 07/19/2016  . Right lower quadrant abdominal pain 07/17/2016    Past Surgical History:  Procedure Laterality Date  . LAPAROSCOPIC APPENDECTOMY N/A 07/18/2016   Procedure: APPENDECTOMY LAPAROSCOPIC;  Surgeon: Avel Peaceodd Rosenbower, MD;  Location: [facility];  Service: General;  Laterality: N/A;  . WISDOM TOOTH EXTRACTION  04/2016   x4       Home Medications    Prior to Admission medications   Medication Sig Start Date End Date Taking? Authorizing Provider  ibuprofen (ADVIL,MOTRIN) 200 MG tablet Take 600 mg by mouth every 6 (six) hours as needed for mild pain.   Yes [provider]    Family History Family History  Problem Relation Age of Onset  . Cancer Maternal Grandmother   . Diabetes Maternal Grandmother   . Heart disease Maternal Grandfather   . Diabetes Paternal Grandmother       Social History Social History   Tobacco Use  . Smoking status: Never Smoker  . Smokeless tobacco: Never Used  Substance Use Topics  . Alcohol use: No  . Drug use: No     Allergies   Patient has no known allergies.   Review of Systems Review of Systems  Constitutional: Negative for chills, diaphoresis, fatigue and fever.  HENT: Negative for congestion.   Eyes: Negative for visual disturbance.  Respiratory: Negative for cough, chest tightness, shortness of breath and wheezing.   Cardiovascular: Negative for chest pain and palpitations.  Gastrointestinal: Positive for abdominal pain, blood in stool (for 2 years intermittently), nausea and vomiting. Negative for constipation and diarrhea.  Genitourinary: Negative for dysuria, flank pain, frequency and hematuria.  Musculoskeletal: Negative for back pain, neck pain and neck stiffness.  Neurological: Negative for light-headedness, numbness and headaches.  Psychiatric/Behavioral: Negative for agitation.  All other systems reviewed and are negative.    Physical Exam Updated Vital Signs BP 127/70 (BP Location: Right Arm)   Pulse 69   Temp 98.7 F (37.1 C) (Oral)   Resp 16   Ht 6' (1.829 m)   Wt 81.6 kg (180 lb)   SpO2 99%   BMI 24.41 kg/m   Physical Exam  Constitutional: He is oriented to person, place, and time. He appears well-developed and well-nourished. No distress.  HENT:  Head: Normocephalic.  Mouth/Throat: Oropharynx is clear and moist.  Eyes: EOM are normal. Pupils are equal,  round, and reactive to light.  Neck: Normal range of motion.  Cardiovascular: Normal rate and intact distal pulses.  No murmur heard. Pulmonary/Chest: Effort normal. He has no wheezes. He has no rales. He exhibits no tenderness.  Abdominal: Soft. Normal appearance. There is tenderness in the right upper quadrant and epigastric area. There is no rebound and no guarding.    Musculoskeletal: He exhibits no tenderness.  Neurological:  He is alert and oriented to person, place, and time. No sensory deficit. He exhibits normal muscle tone.  Skin: Capillary refill takes less than 2 seconds. He is not diaphoretic. No erythema. No pallor.  Psychiatric: He has a normal mood and affect.  Nursing note and vitals reviewed.    ED Treatments / Results  Labs (all labs ordered are listed, but only abnormal results are displayed) Labs Reviewed  COMPREHENSIVE METABOLIC PANEL - Abnormal; Notable for the following components:      Result Value   Glucose, Bld 105 (*)    All other components within normal limits  LIPASE, BLOOD  CBC  URINALYSIS, ROUTINE W REFLEX MICROSCOPIC    EKG  EKG Interpretation None       Radiology Dg Chest 2 View  Result Date: 05/24/2017 CLINICAL DATA:  21 year old male with hematemesis EXAM: CHEST  2 VIEW COMPARISON:  None. FINDINGS: The lungs are clear and negative for focal airspace consolidation, pulmonary edema or suspicious pulmonary nodule. No pleural effusion or pneumothorax. Cardiac and mediastinal contours are within normal limits. No acute fracture or lytic or blastic osseous lesions. The visualized upper abdominal bowel gas pattern is unremarkable. IMPRESSION: Normal chest x-ray. Electronically Signed   By: Malachy MoanHeath  McCullough M.D.   On: 05/24/2017 17:59   Ct Abdomen Pelvis W Contrast  Result Date: 05/24/2017 CLINICAL DATA:  Initial evaluation for acute right upper quadrant and epigastric pain, hematemesis. EXAM: CT ABDOMEN AND PELVIS WITH CONTRAST TECHNIQUE: Multidetector CT imaging of the abdomen and pelvis was performed using the standard protocol following bolus administration of intravenous contrast. CONTRAST:  100mL ISOVUE-300 IOPAMIDOL (ISOVUE-300) INJECTION 61% COMPARISON:  Prior ultrasound from earlier the same day. FINDINGS: Lower chest: Minimal subsegmental atelectasis present at the right lung base. Visualized lungs are otherwise clear. Hepatobiliary: Liver demonstrates no acute  abnormality. Gallbladder within normal limits. No biliary dilatation. Pancreas: Pancreas within normal limits. Spleen: Spleen within normal limits. Adrenals/Urinary Tract: Adrenal glands are normal. Kidneys equal in size with symmetric enhancement. No nephrolithiasis, hydronephrosis, or focal enhancing renal mass. No hydroureter. Bladder within normal limits. Stomach/Bowel: Stomach within normal limits. No evidence for bowel obstruction. Appendix is surgically absent. No abnormal wall thickening, mucosal enhancement, or inflammatory fat stranding seen about the bowels. Vascular/Lymphatic: Normal intravascular enhancement seen throughout the intra-abdominal aorta and its branch vessels. No adenopathy. Reproductive: Prostate normal. Other: No free air or fluid. Musculoskeletal: No acute osseous abnormality. No worrisome lytic or blastic osseous lesions. IMPRESSION: 1. No CT evidence for acute intra-abdominal or pelvic process. 2. Status post appendectomy. Electronically Signed   By: Rise MuBenjamin  McClintock M.D.   On: 05/24/2017 20:52   [place] Abdomen Limited Ruq  Result Date: 05/24/2017 CLINICAL DATA:  Right upper quadrant pain 3 days. EXAM: ULTRASOUND ABDOMEN LIMITED RIGHT UPPER QUADRANT COMPARISON:  None. FINDINGS: Gallbladder: No gallstones or wall thickening visualized. No sonographic Murphy sign noted by sonographer. Common bile duct: Diameter: 2.6 mm. Liver: No focal lesion identified. Within normal limits in parenchymal echogenicity. Portal vein is patent on color Doppler imaging with normal direction of blood flow towards the  liver. IMPRESSION: No acute findings. Electronically Signed   By: Elberta Fortis M.D.   On: 05/24/2017 18:41    Procedures Procedures (including critical care time)  Medications Ordered in ED Medications  morphine 4 MG/ML injection 4 mg (4 mg Intravenous Given 05/24/17 1736)  ondansetron (ZOFRAN) injection 4 mg (4 mg Intravenous Given 05/24/17 1736)  sodium chloride 0.9 % bolus  1,000 mL (0 mLs Intravenous Stopped 05/24/17 1854)  morphine 4 MG/ML injection 4 mg (4 mg Intravenous Given 05/24/17 1900)  gi cocktail (Maalox,Lidocaine,Donnatal) (30 mLs Oral Given 05/24/17 1929)  iopamidol (ISOVUE-300) 61 % injection (100 mLs  Contrast Given 05/24/17 2022)     Initial Impression / Assessment and Plan / ED Course  I have reviewed the triage vital signs and the nursing notes.  Pertinent labs & imaging results that were available during my care of the patient were reviewed by me and considered in my medical decision making (see chart for details).     Armel Rabbani is a 21 y.o. male with a past medical history significant for prior appendectomy and chronic intermittent rectal bleeding who presents with severe nausea, vomiting, and abdominal pain.  Patient reports that 2 days ago, he had onset of nausea.  He said it then progressed to retching vomiting.  He reports vomiting numerous episodes.  He reports he then started having bright blood mixed in with his emesis.  He has never had this before but has had rectal bleeding for the last 2 years.  He says he has it several times a month that is painless with no rectal pain or abdominal pain.  He denies history of hemorrhoids.  He reports that his pain feels as severe as when he had his appendicitis and required emergent appendectomy.  Patient has not not taken medicine to help the symptoms but says that he takes ibuprofen intermittently for chronic knee pains.  He reports that he uses it weekly but not daily.  He did not denies any history of peptic ulcers.  Patient denies any fevers, chills, chest pain, shortness of breath, cough, congestion, urinary symptoms.  He denies any constipation or diarrhea.  He has not had any rectal bleeding recently.  He reports his abdominal pain is a 9 out of 10 in severity in the epigastrium and right upper quadrant.  He denies any history of gallbladder or liver disease to his knowledge.  He denies any  pain in his chest or neck from the vomiting.  On exam, patient had no crepitance of the neck.  Lungs were clear and chest was nontender.  Patient had no focal neurologic deficits.  Abdomen was exquisitely tender in the epigastrium and right upper quadrant.  Right lower quadrant was nontender.  Patient deferred GU exam and rectal exam.  Patient reports that he did not want a rectal exam after he reported no recent rectal bleeding.  Given location of symptoms, a right upper quadrant ultrasound will be obtained.  Laboratory testing also be obtained.  Pain medicine, nausea medicine, and fluids ordered.  Next  Ultrasound was reassuring and laboratory testing was overall reassuring however on reassessment patient is still having exquisite tenderness in the epigastrium and right upper quadrant.  More pain medicine will be given and CT scan will be ordered to look for diverticulitis or other severe abnormality of the abdomen.  GI cocktail also be provided.  Suspect patient may have diverticulosis versus peptic ulcer as etiology of his bleeding and symptoms.  If CT is reassuring, anticipate  discharge with GI follow-up for further imaging.      CT scan showed no significant abnormalities causing the patient's pain.  Ulcer is still considered.  Patient will be started on omeprazole.  Patient was given prescription for pain medicine and nausea medicine and instructed to follow-up with gastroenterology for further evaluation and management.  Patient's pain improved with medications in the ED.  Patient  understood return precautions for any new or worsened symptoms.  Patient had no other questions or concerns and was discharged in good condition.      Final Clinical Impressions(s) / ED Diagnoses   Final diagnoses:  RUQ abdominal pain  Non-intractable vomiting with nausea, unspecified vomiting type  Hematemesis with nausea    ED Discharge Orders        Ordered    omeprazole (PRILOSEC) 20 MG capsule  Daily      05/24/17 2145    oxyCODONE-acetaminophen (PERCOCET/ROXICET) 5-325 MG tablet  Every 4 hours PRN     05/24/17 2145    ondansetron (ZOFRAN) 4 MG tablet  Every 8 hours PRN     05/24/17 2145      Clinical Impression: 1. Non-intractable vomiting with nausea, unspecified vomiting type   2. RUQ abdominal pain   3. Hematemesis with nausea     Disposition: Discharge  Condition: Good  I have discussed the results, Dx and Tx plan with the pt(& family if present). He/she/they expressed understanding and agree(s) with the plan. Discharge instructions discussed at great length. Strict return precautions discussed and pt &/or family have verbalized understanding of the instructions. No further questions at time of discharge.    This SmartLink is deprecated. Use AVSMEDLIST instead to display the medication list for a patient.  Follow Up: [facility]  [facility] [address] [place] [address] [phone]    [facility] EMERGENCY DEPARTMENT [address] [id number] mc [place] [address] [phone]    [facility] [address] [place] [address] [phone] Schedule an appointment as soon as possible for a visit       Tegeler, Canary Brim, MD 05/24/17 2325

## 2017-05-24 NOTE — ED Triage Notes (Signed)
Pt reports vomiting noticeable amounts of blood since Thursday night.  Pt is wincing from abdominal pain.  He also reports bloody BM's for the last two years.  Spoke with Dr. Milus GlazierLauenstein and he recommends the pt go to the ED for further evaluation.

## 2017-05-24 NOTE — ED Notes (Signed)
Hooked patient back up to the monitor patient is resting with call bell in reach 

## 2017-05-24 NOTE — ED Notes (Signed)
Pt back from CT

## 2017-05-24 NOTE — ED Notes (Signed)
Got patient undress into a gown on the monitor patient is now trying to get a urine sample patient is resting with call bell in reach

## 2017-11-10 ENCOUNTER — Ambulatory Visit (INDEPENDENT_AMBULATORY_CARE_PROVIDER_SITE_OTHER): Payer: 59

## 2017-11-10 ENCOUNTER — Encounter: Payer: Self-pay | Admitting: Urgent Care

## 2017-11-10 ENCOUNTER — Ambulatory Visit: Payer: 59 | Admitting: Urgent Care

## 2017-11-10 ENCOUNTER — Other Ambulatory Visit: Payer: Self-pay

## 2017-11-10 ENCOUNTER — Ambulatory Visit: Payer: Self-pay | Admitting: *Deleted

## 2017-11-10 VITALS — BP 151/71 | HR 61 | Temp 98.5°F | Resp 16 | Ht 72.0 in | Wt 175.6 lb

## 2017-11-10 DIAGNOSIS — R42 Dizziness and giddiness: Secondary | ICD-10-CM

## 2017-11-10 DIAGNOSIS — R05 Cough: Secondary | ICD-10-CM

## 2017-11-10 DIAGNOSIS — S6701XA Crushing injury of right thumb, initial encounter: Secondary | ICD-10-CM | POA: Diagnosis not present

## 2017-11-10 DIAGNOSIS — R001 Bradycardia, unspecified: Secondary | ICD-10-CM | POA: Diagnosis not present

## 2017-11-10 DIAGNOSIS — R058 Other specified cough: Secondary | ICD-10-CM

## 2017-11-10 DIAGNOSIS — R0602 Shortness of breath: Secondary | ICD-10-CM

## 2017-11-10 DIAGNOSIS — R062 Wheezing: Secondary | ICD-10-CM

## 2017-11-10 LAB — POCT URINALYSIS DIP (MANUAL ENTRY)
BILIRUBIN UA: NEGATIVE mg/dL
Bilirubin, UA: NEGATIVE
Blood, UA: NEGATIVE
GLUCOSE UA: NEGATIVE mg/dL
Leukocytes, UA: NEGATIVE
Nitrite, UA: NEGATIVE
Protein Ur, POC: NEGATIVE mg/dL
SPEC GRAV UA: 1.015 (ref 1.010–1.025)
UROBILINOGEN UA: 0.2 U/dL
pH, UA: 8 (ref 5.0–8.0)

## 2017-11-10 MED ORDER — PREDNISONE 10 MG PO TABS: 40.0000 mg | ORAL_TABLET | Freq: Every day | ORAL | 0 refills | Status: DC

## 2017-11-10 MED ORDER — HYDROCODONE-HOMATROPINE 5-1.5 MG/5ML PO SYRP: 5.0000 mL | ORAL_SOLUTION | Freq: Every evening | ORAL | 0 refills | Status: DC | PRN

## 2017-11-10 MED ORDER — BENZONATATE 100 MG PO CAPS: 100.0000 mg | ORAL_CAPSULE | Freq: Three times a day (TID) | ORAL | 0 refills | Status: DC | PRN

## 2017-11-10 NOTE — Progress Notes (Signed)
MRN: [id number] DOB: 07/09/1995  Subjective:   Tanner Flores is a 22 y.o. male presenting for 2-day history of persistent dizziness, confusion.  Patient reports that he has been checking his blood pressure has ranged from 90-200 systolic and 40-100 diastolic.  Reports that he is had an intermittent headache as well, has a history of migraines this headache was not as bad.  He also admits that he has had a 1 month history of a productive cough that is eliciting chest pain, shortness of breath, wheezing.  He did have nausea with vomiting x3 episodes yesterday.  Has a history of allergies but is not taking anything for them currently, has used Benadryl in the past.  Admits ongoing nasal congestion and mild sore throat.  Smokes 1 pack cigarettes every 3 days.  Denies drinking alcohol.  Denies smoking marijuana or other drug use.  Patient would also like to see about his right thumb.  Reports that 4 days ago he had a crushing injury, [facility] slammed his finger up against the stair rail.  Reports that he went about a finger splint and has been wearing it since.  He would like to know if it is broken.   Tanner Flores is not currently taking any medications.  Also has No Known Allergies.  Tanner Flores  has a past medical history of Right knee pain (01/02/2017). Also  has a past surgical history that includes laparoscopic appendectomy (N/A, 07/18/2016) and Wisdom tooth extraction (04/2016).  Objective:   Vitals: BP (!) 151/71   Pulse 61   Temp 98.5 F (36.9 C) (Oral)   Resp 16   Ht 6' (1.829 m)   Wt 175 lb 9.6 oz (79.7 kg)   SpO2 99%   BMI 23.82 kg/m   BP Readings from Last 3 Encounters:  11/10/17 (!) 151/71  05/24/17 101/75  02/21/17 122/73    Orthostatic VS for the past 24 hrs:  BP- Lying Pulse- Lying BP- Sitting Pulse- Sitting BP- Standing at 0 minutes Pulse- Standing at 0 minutes  11/10/17 1627 118/76 (!) 45 120/74 (!) 47 116/73 (!) 45   Physical Exam    Constitutional: He is oriented to person, place, and time. He appears well-developed and well-nourished.  HENT:  TMs opaque bilaterally but without erythema.  No sinus tenderness.  Throat with significant postnasal drainage and cobblestone pattern.  Eyes: Right eye exhibits no discharge. Left eye exhibits no discharge. No scleral icterus.  Neck: Normal range of motion. Neck supple.  Cardiovascular: Normal rate, regular rhythm and intact distal pulses. Exam reveals no gallop and no friction rub.  No murmur heard. Pulmonary/Chest: Effort normal and breath sounds normal. No stridor. No respiratory distress. He has no wheezes. He has no rales.  Musculoskeletal: He exhibits no edema.       Hands: Lymphadenopathy:    He has no cervical adenopathy.  Neurological: He is alert and oriented to person, place, and time.  Skin: Skin is warm and dry.   Results for orders placed or performed in visit on 11/10/17 (from the past 24 hour(s))  POCT urinalysis dipstick     Status: None   Collection Time: 11/10/17  4:12 PM  Result Value Ref Range   Color, UA yellow yellow   Clarity, UA clear clear   Glucose, UA negative negative mg/dL   Bilirubin, UA negative negative   Ketones, POC UA negative negative mg/dL   Spec Grav, UA [phone] - 1.025  Blood, UA negative negative   pH, UA 8.0 5.0 - 8.0   Protein Ur, POC negative negative mg/dL   Urobilinogen, UA 0.2 0.2 or 1.0 E.U./dL   Nitrite, UA Negative Negative   Leukocytes, UA Negative Negative    Dg Chest 2 View  Result Date: 11/10/2017 CLINICAL DATA:  22 year old male with a history of productive cough EXAM: CHEST - 2 VIEW COMPARISON:  05/24/2017 FINDINGS: The heart size and mediastinal contours are within normal limits. Both lungs are clear. The visualized skeletal structures are unremarkable. IMPRESSION: Negative for acute cardiopulmonary disease Electronically Signed   By: Gilmer Mor D.O.   On: 11/10/2017 15:50   Dg Finger Thumb  Right  Result Date: 11/10/2017 CLINICAL DATA:  Trauma to right thumb.  Pain. EXAM: RIGHT THUMB 2+V COMPARISON:  None. FINDINGS: There is no evidence of fracture or dislocation. There is no evidence of arthropathy or other focal bone abnormality. Soft tissues are unremarkable IMPRESSION: Negative. Electronically Signed   By: Gerome Sam III M.D   On: 11/10/2017 15:51    Assessment and Plan :   Productive cough - Plan: DG Chest 2 View  Shortness of breath - Plan: DG Chest 2 View, Basic metabolic panel, CANCELED: EKG [id number]  Wheezing - Plan: DG Chest 2 View  Dizziness - Plan: Thyroid Panel With TSH, CBC, POCT urinalysis dipstick, Orthostatic vital signs, Basic metabolic panel, CANCELED: EKG 78-GNFA  Crushing injury of right thumb, initial encounter - Plan: DG Finger Thumb Right  Bradycardia  Suspect patient has a persistent and severe cough that is causing his symptoms, labs pending.  Will start cough suppression medications with Tessalon and Hycodan.  Start short steroid course at 40 mg for 5 days. Counseled patient on potential for adverse effects with medications prescribed today, patient verbalized understanding. Return-to-clinic precautions discussed, patient verbalized understanding.  Labs pending, will follow up with those results.  Patient declined EKG.  Counseled that I suspect his bradycardia is due to healthy and active lifestyle.  [name], PA-C [facility] [phone] 11/10/2017  3:11 PM

## 2017-11-10 NOTE — Telephone Encounter (Signed)
Patient called with lightheadedness and feeling foggy for 2 days now. States feels "foggy" even while lying down. Gets worse when he stands up. Denies any CP/SOB/Vertigo. Stated his parent checked his B/P yesterday it was 90/47. 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 Has had one bottle of water today. He has cough and occasional wheeze that has lingered from a cold one month ago. Appointment made for this afternoon.  Reason for Disposition . [1] MILD dizziness (e.g., walking normally) AND [2] has NOT been evaluated by physician for this  (Exception: dizziness caused by heat exposure, sudden standing, or poor fluid intake)  Answer Assessment - Initial Assessment Questions 1. DESCRIPTION: "Describe your dizziness."     Foggy headed-feels a little drunk. 2. LIGHTHEADED: "Do you feel lightheaded?" (e.g., somewhat faint, woozy, weak upon standing)     Has felt like he might faint a couple of times over the last two days. 3. VERTIGO: "Do you feel like either you or the room is spinning or tilting?" (i.e. vertigo)     no 4. SEVERITY: "How bad is it?"  "Do you feel like you are going to faint?" "Can you stand and walk?"   - MILD - walking normally   - MODERATE - interferes with normal activities (e.g., work, school)    - SEVERE - unable to stand, requires support to walk, feels like passing out now.      Mild to moderate. 5. ONSET:  "When did the dizziness begin?"     Yesterday afternoon 6. AGGRAVATING FACTORS: "Does anything make it worse?" (e.g., standing, change in head position)     Sitting to standing increases the fogginess. Lying down he still feels foggy. 7. HEART RATE: "Can you tell me your heart rate?" "How many beats in 15 seconds?"  (Note: not all patients can do this)       No unable 8. CAUSE: "What do you think is causing the dizziness?"     No idea. He is staying hydrated and eating.  9. RECURRENT SYMPTOM: "Have you had dizziness before?" If so, ask: "When was the last  time?" "What happened that time?"     Yes, occasional but doesn't remember when and situation.  10. OTHER SYMPTOMS: "Do you have any other symptoms?" (e.g., fever, chest pain, vomiting, diarrhea, bleeding)       Occasional cough and wheeze lingering since a cold one month ago. Has had loose stools for 2 weeks. Around 4 stools a day.  11. PREGNANCY: "Is there any chance you are pregnant?" "When was your last menstrual period?"       na  Protocols used: DIZZINESS [facility]- LIGHTHEADEDNESS-A-AH

## 2017-11-10 NOTE — Patient Instructions (Addendum)
Hydrate well with at least 2 liters (1 gallon) of water daily.  Please also pick up Zyrtec at a dose of 10 mg once daily.  Hold off on smoking at least until you get better because this can certainly make things worse and drawn out as far as her cough goes.  I will let you know about your results once they come back to me.    Dizziness Dizziness is a common problem. It is a feeling of unsteadiness or light-headedness. You may feel like you are about to faint. Dizziness can lead to injury if you stumble or fall. Anyone can become dizzy, but dizziness is more common in older adults. This condition can be caused by a number of things, including medicines, dehydration, or illness. Follow these instructions at home: Eating and drinking  Drink enough fluid to keep your urine clear or pale yellow. This helps to keep you from becoming dehydrated. Try to drink more clear fluids, such as water.  Do not drink alcohol.  Limit your caffeine intake if told to do so by your health care provider. Check ingredients and nutrition facts to see if a food or beverage contains caffeine.  Limit your salt (sodium) intake if told to do so by your health care provider. Check ingredients and nutrition facts to see if a food or beverage contains sodium. Activity  Avoid making quick movements. ? Rise slowly from chairs and steady yourself until you feel okay. ? In the morning, first sit up on the side of the bed. When you feel okay, stand slowly while you hold onto something until you know that your balance is fine.  If you need to stand in one place for a long time, move your legs often. Tighten and relax the muscles in your legs while you are standing.  Do not drive or use heavy machinery if you feel dizzy.  Avoid bending down if you feel dizzy. Place items in your home so that they are easy for you to reach without leaning over. Lifestyle  Do not use any products that contain nicotine or tobacco, such as  cigarettes and e-cigarettes. If you need help quitting, ask your health care provider.  Try to reduce your stress level by using methods such as yoga or meditation. Talk with your health care provider if you need help to manage your stress. General instructions  Watch your dizziness for any changes.  Take over-the-counter and prescription medicines only as told by your health care provider. Talk with your health care provider if you think that your dizziness is caused by a medicine that you are taking.  Tell a friend or a family member that you are feeling dizzy. If he or she notices any changes in your behavior, have this person call your health care provider.  Keep all follow-up visits as told by your health care provider. This is important. Contact a health care provider if:  Your dizziness does not go away.  Your dizziness or light-headedness gets worse.  You feel nauseous.  You have reduced hearing.  You have new symptoms.  You are unsteady on your feet or you feel like the room is spinning. Get help right away if:  You vomit or have diarrhea and are unable to eat or drink anything.  You have problems talking, walking, swallowing, or using your arms, hands, or legs.  You feel generally weak.  You are not thinking clearly or you have trouble forming sentences. It may take a friend or  family member to notice this.  You have chest pain, abdominal pain, shortness of breath, or sweating.  Your vision changes.  You have any bleeding.  You have a severe headache.  You have neck pain or a stiff neck.  You have a fever. These symptoms may represent a serious problem that is an emergency. Do not wait to see if the symptoms will go away. Get medical help right away. Call your local emergency services (911 in the [place] Do not drive yourself to the hospital. Summary  Dizziness is a feeling of unsteadiness or light-headedness. This condition can be caused by a number of  things, including medicines, dehydration, or illness.  Anyone can become dizzy, but dizziness is more common in older adults.  Drink enough fluid to keep your urine clear or pale yellow. Do not drink alcohol.  Avoid making quick movements if you feel dizzy. Monitor your dizziness for any changes. This information is not intended to replace advice given to you by your health care provider. Make sure you discuss any questions you have with your health care provider. Document Released: 11/13/2000 Document Revised: 06/22/2016 Document Reviewed: 06/22/2016 Elsevier Interactive Patient Education  2018 [employer]     IF you received an x-ray today, you will receive an invoice from [facility]. Please contact [facility] at [phone] with questions or concerns regarding your invoice.   IF you received labwork today, you will receive an invoice from [facility]. Please contact [facility] at [phone] with questions or concerns regarding your invoice.   Our billing staff will not be able to assist you with questions regarding bills from these companies.  You will be contacted with the lab results as soon as they are available. The fastest way to get your results is to activate your My Chart account. Instructions are located on the last page of this paperwork. If you have not heard from us regarding the results in 2 weeks, please contact this office.

## 2017-11-11 LAB — BASIC METABOLIC PANEL
BUN/Creatinine Ratio: 10 (ref 9–20)
BUN: 8 mg/dL (ref 6–20)
CALCIUM: 9.6 mg/dL (ref 8.7–10.2)
CHLORIDE: 104 mmol/L (ref 96–106)
CO2: 25 mmol/L (ref 20–29)
Creatinine, Ser: 0.82 mg/dL (ref 0.76–1.27)
GFR, EST AFRICAN AMERICAN: 146 mL/min/{1.73_m2} (ref >59)
GFR, EST NON AFRICAN AMERICAN: 126 mL/min/{1.73_m2} (ref >59)
Glucose: 97 mg/dL (ref 65–99)
Potassium: 5.2 mmol/L (ref 3.5–5.2)
Sodium: 143 mmol/L (ref 134–144)

## 2017-11-11 LAB — CBC
HEMATOCRIT: 48.8 % (ref 37.5–51.0)
Hemoglobin: 16.4 g/dL (ref 13.0–17.7)
MCH: 30.7 pg (ref 26.6–33.0)
MCHC: 33.6 g/dL (ref 31.5–35.7)
MCV: 91 fL (ref 79–97)
Platelets: 299 10*3/uL (ref 150–450)
RBC: 5.35 x10E6/uL (ref 4.14–5.80)
RDW: 13.9 % (ref 12.3–15.4)
WBC: 7.1 10*3/uL (ref 3.4–10.8)

## 2017-11-11 LAB — THYROID PANEL WITH TSH
Free Thyroxine Index: 1.6 (ref 1.2–4.9)
T3 Uptake Ratio: 27 % (ref 24–39)
T4, Total: 5.9 ug/dL (ref 4.5–12.0)
TSH: 0.942 u[IU]/mL (ref 0.450–4.500)

## 2017-12-15 ENCOUNTER — Ambulatory Visit (INDEPENDENT_AMBULATORY_CARE_PROVIDER_SITE_OTHER): Payer: 59 | Admitting: Physician Assistant

## 2017-12-15 ENCOUNTER — Encounter: Payer: Self-pay | Admitting: Physician Assistant

## 2017-12-15 VITALS — BP 107/68 | HR 75 | Temp 98.3°F | Resp 16 | Ht 72.0 in | Wt 173.4 lb

## 2017-12-15 DIAGNOSIS — F329 Major depressive disorder, single episode, unspecified: Secondary | ICD-10-CM | POA: Insufficient documentation

## 2017-12-15 MED ORDER — PAROXETINE HCL 20 MG PO TABS: 20.0000 mg | ORAL_TABLET | Freq: Every day | ORAL | 3 refills | Status: DC

## 2017-12-15 NOTE — Patient Instructions (Addendum)
Please call your insurance and check coverage for any of the following psychological or psychiatric service. None of these services take physician referrals - they always want the patient to call. Some excellent private psychiatrists for individual counseling are below.  Start taking Paxil 20 mg once daily; You may increase the dose after 2-3 weeks if you are not feeling improvement. You can add a half tablet to make 82m/daily. Do this for 1-2 weeks. If no improvement, increase to 456mdaily.  Come back and see me in 4-6 weeks.   You can call the 24-hour [facility]le [facility] [phone] immediate assistance. Among several different types of services, they offer an Intensive oupatient program for mood disorders - which is a group type setting Monday-Friday 9-noon.  You can schedule an assessment by calling the above numbers during which the costs for the program and insurance benefits will be reviewed.    [place] [address]6256hone: [phone] Beaver[address], GrSouth SeavilleNC [phone]) 63[address] WestGrAlvarado[place] [address]Phone: [phone]alsBloomington[address]GrHoffmanNC [phone] [address]uMarionrGoodenowNCYQ[address]hone: [address]Carolynne EdouardrFlorham Park[place] [phone] 27FranklinMD, PAMcKenzierSchuylerSuGlassport[place] [address]16hone: [phone] can always go to WeElvina [facility]leR if suicidal or there are walk-in crisis services available at: [facility]0[place]field[place] [phone] of operations: Monday-Friday, 8:30-5 p.m.   They take medicare/medicaid and the patient can contact [facility] at [phone] [email or web address] for more information.   Thank you for coming in today. I hope you feel we met your needs.  Feel free to call PCP if you have any questions or further requests.  Please consider signing up for MyChart if you do not already have it, as this is a great way to communicate with me.  Best,  Whitney McVey, PA-C   IF you received an x-ray today, you will receive an invoice from [facility]. Please contact [facility] at [phone] questions or concerns regarding your invoice.   IF you received labwork today, you will receive an invoice from [place] [facility] contact [facility] at [phone] questions or concerns regarding your invoice.   Our billing staff will not be able to assist you with questions regarding bills from these companies.  You will be contacted with the lab results as soon as they are available. The fastest way to get your results is to activate your My Chart account. Instructions are located on the last page of this paperwork. If you have not heard from usKoreaegarding the results in 2 weeks, please contact this office.
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
## 2018-01-14 ENCOUNTER — Other Ambulatory Visit: Payer: Self-pay

## 2018-01-14 ENCOUNTER — Encounter: Payer: Self-pay | Admitting: Physician Assistant

## 2018-01-14 ENCOUNTER — Ambulatory Visit (INDEPENDENT_AMBULATORY_CARE_PROVIDER_SITE_OTHER): Payer: 59 | Admitting: Physician Assistant

## 2018-01-14 VITALS — BP 120/70 | HR 56 | Temp 98.5°F | Resp 16 | Ht 70.0 in | Wt 171.0 lb

## 2018-01-14 DIAGNOSIS — F329 Major depressive disorder, single episode, unspecified: Secondary | ICD-10-CM

## 2018-01-14 MED ORDER — ESCITALOPRAM OXALATE 10 MG PO TABS: 10.0000 mg | ORAL_TABLET | Freq: Every day | ORAL | 2 refills | Status: DC

## 2018-01-14 NOTE — Patient Instructions (Addendum)
Lexapro: start taking 10 mg once daily.   The dose may be increased in 10 mg increments 2-3 weeks based on response ... So increase to 20mg  after 2-3 weeks if needed. Then to 30mg  after 2-3 weeks if needed.  Come back and see me in 4-6 weeks to recheck.  (Usual dose is 20-30mg /day)  Escitalopram tablets What is this medicine? ESCITALOPRAM (es sye TAL oh pram) is used to treat depression and certain types of anxiety. This medicine may be used for other purposes; ask your health care provider or pharmacist if you have questions. COMMON BRAND NAME(S): Lexapro What should I tell my health care provider before I take this medicine? They need to know if you have any of these conditions: -bipolar disorder or a family history of bipolar disorder -diabetes -glaucoma -heart disease -kidney or liver disease -receiving electroconvulsive therapy -seizures (convulsions) -suicidal thoughts, plans, or attempt by you or a family member -an unusual or allergic reaction to escitalopram, the related drug citalopram, other medicines, foods, dyes, or preservatives -pregnant or trying to become pregnant -breast-feeding How should I use this medicine? Take this medicine by mouth with a glass of water. Follow the directions on the prescription label. You can take it with or without food. If it upsets your stomach, take it with food. Take your medicine at regular intervals. Do not take it more often than directed. Do not stop taking this medicine suddenly except upon the advice of your doctor. Stopping this medicine too quickly may cause serious side effects or your condition may worsen. A special MedGuide will be given to you by the pharmacist with each prescription and refill. Be sure to read this information carefully each time. Talk to your pediatrician regarding the use of this medicine in children. Special care may be needed. Overdosage: If you think you have taken too much of this medicine contact a  poison control center or emergency room at once. NOTE: This medicine is only for you. Do not share this medicine with others. What if I miss a dose? If you miss a dose, take it as soon as you can. If it is almost time for your next dose, take only that dose. Do not take double or extra doses. What may interact with this medicine? Do not take this medicine with any of the following medications: -certain medicines for fungal infections like fluconazole, itraconazole, ketoconazole, posaconazole, voriconazole -cisapride -citalopram -dofetilide -dronedarone -linezolid -MAOIs like Carbex, Eldepryl, Marplan, Nardil, and Parnate -methylene blue (injected into a vein) -pimozide -thioridazine -ziprasidone This medicine may also interact with the following medications: -alcohol -amphetamines -aspirin and aspirin-like medicines -carbamazepine -certain medicines for depression, anxiety, or psychotic disturbances -certain medicines for migraine headache like almotriptan, eletriptan, frovatriptan, naratriptan, rizatriptan, sumatriptan, zolmitriptan -certain medicines for sleep -certain medicines that treat or prevent blood clots like warfarin, enoxaparin, dalteparin -cimetidine -diuretics -fentanyl -furazolidone -isoniazid -lithium -metoprolol -NSAIDs, medicines for pain and inflammation, like ibuprofen or naproxen -other medicines that prolong the QT interval (cause an abnormal heart rhythm) -procarbazine -rasagiline -supplements like St. John's wort, kava kava, valerian -tramadol -tryptophan This list may not describe all possible interactions. Give your health care provider a list of all the medicines, herbs, non-prescription drugs, or dietary supplements you use. Also tell them if you smoke, drink alcohol, or use illegal drugs. Some items may interact with your medicine. What should I watch for while using this medicine? Tell your doctor if your symptoms do not get better or if they  get worse. Visit  your doctor or health care professional for regular checks on your progress. Because it may take several weeks to see the full effects of this medicine, it is important to continue your treatment as prescribed by your doctor. Patients and their families should watch out for new or worsening thoughts of suicide or depression. Also watch out for sudden changes in feelings such as feeling anxious, agitated, panicky, irritable, hostile, aggressive, impulsive, severely restless, overly excited and hyperactive, or not being able to sleep. If this happens, especially at the beginning of treatment or after a change in dose, call your health care professional. Bonita QuinYou may get drowsy or dizzy. Do not drive, use machinery, or do anything that needs mental alertness until you know how this medicine affects you. Do not stand or sit up quickly, especially if you are an older patient. This reduces the risk of dizzy or fainting spells. Alcohol may interfere with the effect of this medicine. Avoid alcoholic drinks. Your mouth may get dry. Chewing sugarless gum or sucking hard candy, and drinking plenty of water may help. Contact your doctor if the problem does not go away or is severe. What side effects may I notice from receiving this medicine? Side effects that you should report to your doctor or health care professional as soon as possible: -allergic reactions like skin rash, itching or hives, swelling of the face, lips, or tongue -anxious -black, tarry stools -changes in vision -confusion -elevated mood, decreased need for sleep, racing thoughts, impulsive behavior -eye pain -fast, irregular heartbeat -feeling faint or lightheaded, falls -feeling agitated, angry, or irritable -hallucination, loss of contact with reality -loss of balance or coordination -loss of memory -painful or prolonged erections -restlessness, pacing, inability to keep still -seizures -stiff muscles -suicidal thoughts or  other mood changes -trouble sleeping -unusual bleeding or bruising -unusually weak or tired -vomiting Side effects that usually do not require medical attention (report to your doctor or health care professional if they continue or are bothersome): -changes in appetite -change in sex drive or performance -headache -increased sweating -indigestion, nausea -tremors This list may not describe all possible side effects. Call your doctor for medical advice about side effects. You may report side effects to FDA at 1-800-FDA-1088. Where should I keep my medicine? Keep out of reach of children. Store at room temperature between 15 and 30 degrees C (59 and 86 degrees F). Throw away any unused medicine after the expiration date. NOTE: This sheet is a summary. It may not cover all possible information. If you have questions about this medicine, talk to your doctor, pharmacist, or health care provider.  2018 Elsevier/Gold Standard (2015-10-23 13:20:23)  IF you received an x-ray today, you will receive an invoice from [facility]. Please contact [facility] at [phone] with questions or concerns regarding your invoice.   IF you received labwork today, you will receive an invoice from [facility]. Please contact [facility] at [phone] with questions or concerns regarding your invoice.   Our billing staff will not be able to assist you with questions regarding bills from these companies.  You will be contacted with the lab results as soon as they are available. The fastest way to get your results is to activate your My Chart account. Instructions are located on the last page of this paperwork. If you have not heard from us regarding the results in 2 weeks, please contact this office.

## 2018-01-14 NOTE — Progress Notes (Signed)
   Tanner ChangJoshua Flores  MRN: [id number] DOB: [birth year]  PCP: Sebastian AcheMcVey, Renuka Farfan Whitney, PA-C  Subjective:  Pt is a 22 year old male who presents to clinic for f/u depression. He was started on Paxil 20mg  on 7/15. Seemed to help the first week, but then stopped working. He is taking 40mg /daily with no response and would like to try something different.  "I still feel like not doing anything" He is not exercising.  Still in 6 year relationship with girlfriend - this is going well.  He has not seen therapist. He is still under his fathers' insurance and dad does not want to pay for this. (per pt) Denies HI or SI.   Review of Systems  Gastrointestinal: Negative for abdominal pain, diarrhea, nausea and vomiting.  Psychiatric/Behavioral: Positive for dysphoric mood. Negative for self-injury and suicidal ideas.    Patient Active Problem List   Diagnosis Date Noted  . Major depressive disorder with current active episode 12/15/2017  . Right knee pain 01/02/2017  . Acute mesenteric adenitis 07/19/2016  . Right lower quadrant abdominal pain 07/17/2016    Current Outpatient Medications on File Prior to Visit  Medication Sig Dispense Refill  . PARoxetine (PAXIL) 20 MG tablet Take 1 tablet (20 mg total) by mouth daily. 30 tablet 3   No current facility-administered medications on file prior to visit.     No Known Allergies   Objective:  BP 120/70 (BP Location: Right Arm, Patient Position: Sitting, Cuff Size: Normal)   Pulse (!) 56   Temp 98.5 F (36.9 C) (Oral)   Resp 16   Ht 5\' 10"  (1.778 m)   Wt 171 lb (77.6 kg)   SpO2 99%   BMI 24.54 kg/m   Physical Exam  Constitutional: He is oriented to person, place, and time. He appears well-developed and well-nourished.  Cardiovascular: Normal rate and regular rhythm.  Neurological: He is alert and oriented to person, place, and time.  Skin: Skin is warm and dry.  Psychiatric: He has a normal mood and affect. His behavior is normal.  Judgment and thought content normal.  Vitals reviewed.   Assessment and Plan :  1. Major depressive disorder with current active episode, unspecified depression episode severity, unspecified whether recurrent - pt presents f/u depression. Paxil is not working. Denies SI or HI. Plan to cross taper to Lexapro 10mg  with instruction to taper up to 20 mg/day if needed. RTC in 4-6 weeks to recheck.  - escitalopram (LEXAPRO) 10 MG tablet; Take 1 tablet (10 mg total) by mouth daily. Dose may be increased in 10 mg increments after 2-3 weeks based on response. Max dose 20-30 mg  Dispense: 60 tablet; Refill: 2    Tanner CollieWhitney Island Dohmen, PA-C  Primary Care at [facility] 01/14/2018 4:45 PM  Please note: Portions of this report may have been transcribed using dragon voice recognition software. Every effort was made to ensure accuracy; however, inadvertent computerized transcription errors may be present.

## 2018-01-15 ENCOUNTER — Ambulatory Visit: Payer: 59 | Admitting: Physician Assistant

## 2018-02-05 ENCOUNTER — Ambulatory Visit (HOSPITAL_COMMUNITY): Admission: RE | Admit: 2018-02-05 | Payer: 59 | Source: Ambulatory Visit

## 2018-02-05 ENCOUNTER — Encounter: Payer: Self-pay | Admitting: Family Medicine

## 2018-02-05 ENCOUNTER — Other Ambulatory Visit: Payer: Self-pay

## 2018-02-05 ENCOUNTER — Ambulatory Visit (INDEPENDENT_AMBULATORY_CARE_PROVIDER_SITE_OTHER): Payer: 59 | Admitting: Family Medicine

## 2018-02-05 VITALS — BP 116/79 | HR 81 | Temp 98.3°F | Ht 71.0 in | Wt 167.6 lb

## 2018-02-05 DIAGNOSIS — R112 Nausea with vomiting, unspecified: Secondary | ICD-10-CM

## 2018-02-05 DIAGNOSIS — R300 Dysuria: Secondary | ICD-10-CM

## 2018-02-05 DIAGNOSIS — R1011 Right upper quadrant pain: Secondary | ICD-10-CM | POA: Diagnosis not present

## 2018-02-05 LAB — POCT CBC
Granulocyte percent: 67.4 %G (ref 37–80)
HCT, POC: 47.8 % (ref 43.5–53.7)
HEMOGLOBIN: 16.1 g/dL (ref 14.1–18.1)
LYMPH, POC: 2.1 (ref 0.6–3.4)
MCH, POC: 30.4 pg (ref 27–31.2)
MCHC: 33.6 g/dL (ref 31.8–35.4)
MCV: 90.5 fL (ref 80–97)
MID (cbc): 0.3 (ref 0–0.9)
MPV: 8.5 fL (ref 0–99.8)
POC GRANULOCYTE: 4.9 (ref 2–6.9)
POC LYMPH %: 28.3 % (ref 10–50)
POC MID %: 4.3 %M (ref 0–12)
Platelet Count, POC: 248 10*3/uL (ref 142–424)
RBC: 5.28 M/uL (ref 4.69–6.13)
RDW, POC: 13.6 %
WBC: 7.3 10*3/uL (ref 4.6–10.2)

## 2018-02-05 LAB — POCT URINALYSIS DIP (MANUAL ENTRY)
BILIRUBIN UA: NEGATIVE
BILIRUBIN UA: NEGATIVE mg/dL
Blood, UA: NEGATIVE
Glucose, UA: NEGATIVE mg/dL
LEUKOCYTES UA: NEGATIVE
Nitrite, UA: NEGATIVE
Spec Grav, UA: 1.015 (ref 1.010–1.025)
Urobilinogen, UA: 0.2 E.U./dL
pH, UA: 6 (ref 5.0–8.0)

## 2018-02-05 LAB — COMPREHENSIVE METABOLIC PANEL
ALT: 14 IU/L (ref 0–44)
AST: 15 IU/L (ref 0–40)
Albumin/Globulin Ratio: 2.1 (ref 1.2–2.2)
Albumin: 4.9 g/dL (ref 3.5–5.5)
Alkaline Phosphatase: 98 IU/L (ref 39–117)
BUN/Creatinine Ratio: 12 (ref 9–20)
BUN: 10 mg/dL (ref 6–20)
Bilirubin Total: 0.6 mg/dL (ref 0.0–1.2)
CALCIUM: 9.5 mg/dL (ref 8.7–10.2)
CO2: 22 mmol/L (ref 20–29)
Chloride: 112 mmol/L — ABNORMAL HIGH (ref 96–106)
Creatinine, Ser: 0.81 mg/dL (ref 0.76–1.27)
GFR, EST AFRICAN AMERICAN: 147 mL/min/{1.73_m2} (ref >59)
GFR, EST NON AFRICAN AMERICAN: 127 mL/min/{1.73_m2} (ref >59)
GLUCOSE: 113 mg/dL — AB (ref 65–99)
Globulin, Total: 2.3 g/dL (ref 1.5–4.5)
Potassium: 4.3 mmol/L (ref 3.5–5.2)
Sodium: 142 mmol/L (ref 134–144)
TOTAL PROTEIN: 7.2 g/dL (ref 6.0–8.5)

## 2018-02-05 LAB — POC MICROSCOPIC URINALYSIS (UMFC): MUCUS RE: ABSENT

## 2018-02-05 MED ORDER — ONDANSETRON 4 MG PO TBDP: 4.0000 mg | ORAL_TABLET | Freq: Three times a day (TID) | ORAL | 0 refills | Status: DC | PRN

## 2018-02-05 NOTE — Progress Notes (Signed)
Subjective:  By signing my name below, I, [name], attest that this documentation has been prepared under the direction and in the presence of Shade Flood, MD Electronically Signed: Charline Bills, ED Scribe 02/05/2018 at 10:27 AM.   Patient ID: Tanner Flores, male    DOB: December 13, 1995, 22 y.o.   MRN: [id number]  Chief Complaint  Patient presents with  . pain on right side of rib    going on since monday (stabing and cramping pain )  . Emesis  . burning urine   HPI Tanner Flores is a 22 y.o. male who presents to Primary Care at [facility] complaining of abdominal pain, vomiting and dysuria x 3 days. Diagnosed with mesenteric adenitis in 2018. CT ab/pelvis 05/24/17 without acute abdomen or pelvis process. S/p appendectomy. Followed by Marco Collie for depression, last seen 8/14. Started on lexapro with option of increased doses.  Pt reports sharp, stabbing, cramping R sided abdominal pain x 3 days that wraps around to his R side. He reports increased pain after eating. Pt reports associated symptoms of hot flashes without measured fever, emesis, looser stools, dysuria. 2 episodes of emesis this morning but reports 2-3 voids this morning. Pt is sexually active with his girlfriend; denies new sexual contact, penile discharge, testicular pain, hematuria, h/o STI. No meds tried PTA.  Patient Active Problem List   Diagnosis Date Noted  . Major depressive disorder with current active episode 12/15/2017  . Right knee pain 01/02/2017  . Acute mesenteric adenitis 07/19/2016  . Right lower quadrant abdominal pain 07/17/2016   Past Medical History:  Diagnosis Date  . Right knee pain 01/02/2017   Past Surgical History:  Procedure Laterality Date  . LAPAROSCOPIC APPENDECTOMY N/A 07/18/2016   Procedure: APPENDECTOMY LAPAROSCOPIC;  Surgeon: Avel Peace, MD;  Location: [facility];  Service: General;  Laterality: N/A;  . WISDOM TOOTH EXTRACTION  04/2016   x4   No Known Allergies Prior to  Admission medications   Medication Sig Start Date End Date Taking? Authorizing Provider  escitalopram (LEXAPRO) 10 MG tablet Take 1 tablet (10 mg total) by mouth daily. Dose may be increased in 10 mg increments after 2-3 weeks based on response. Max dose 20-30 mg 01/14/18   McVey, Madelaine Bhat, PA-C  PARoxetine (PAXIL) 20 MG tablet Take 1 tablet (20 mg total) by mouth daily. 12/15/17   McVey, Madelaine Bhat, PA-C   Social History   Socioeconomic History  . Marital status: Single    Spouse name: Not on file  . Number of children: Not on file  . Years of education: 24  . Highest education level: Not on file  Occupational History  . Not on file  Social Needs  . Financial resource strain: Not on file  . Food insecurity:    Worry: Not on file    Inability: Not on file  . Transportation needs:    Medical: Not on file    Non-medical: Not on file  Tobacco Use  . Smoking status: Never Smoker  . Smokeless tobacco: Never Used  Substance and Sexual Activity  . Alcohol use: No  . Drug use: No  . Sexual activity: Not on file  Lifestyle  . Physical activity:    Days per week: Not on file    Minutes per session: Not on file  . Stress: Not on file  Relationships  . Social connections:    Talks on phone: Not on file    Gets together: Not on file  Attends religious service: Not on file    Active member of club or organization: Not on file    Attends meetings of clubs or organizations: Not on file    Relationship status: Not on file  . Intimate partner violence:    Fear of current or ex partner: Not on file    Emotionally abused: Not on file    Physically abused: Not on file    Forced sexual activity: Not on file  Other Topics Concern  . Not on file  Social History Narrative   Lives with parents   Caffeine use: 3 sodas per day   Review of Systems  Constitutional: Positive for diaphoresis. Negative for fever.  Gastrointestinal: Positive for abdominal pain, diarrhea and  vomiting.  Genitourinary: Positive for dysuria. Negative for discharge, hematuria and testicular pain.      Objective:   Physical Exam  Constitutional: He is oriented to person, place, and time. He appears well-developed and well-nourished. No distress.  HENT:  Head: Normocephalic and atraumatic.  Mouth/Throat: Mucous membranes are normal.  Eyes: Conjunctivae and EOM are normal.  Neck: Neck supple. No tracheal deviation present.  Cardiovascular: Normal rate.  Pulmonary/Chest: Effort normal. No respiratory distress.  Abdominal: There is guarding (minimal), CVA tenderness (minimal R-sided) and positive Murphy's sign (on R with localized RUQ tenderness).  Minimal RLQ tenderness  Musculoskeletal: Normal range of motion.  Neurological: He is alert and oriented to person, place, and time.  Skin: Skin is warm and dry.  Psychiatric: He has a normal mood and affect. His behavior is normal.  Nursing note and vitals reviewed.  There were no vitals filed for this visit.   Results for orders placed or performed in visit on 02/05/18  POCT urinalysis dipstick  Result Value Ref Range   Color, UA yellow yellow   Clarity, UA clear clear   Glucose, UA negative negative mg/dL   Bilirubin, UA negative negative   Ketones, POC UA negative negative mg/dL   Spec Grav, UA 1.610 9.604 - 1.025   Blood, UA negative negative   pH, UA 6.0 5.0 - 8.0   Protein Ur, POC =100 (A) negative mg/dL   Urobilinogen, UA 0.2 0.2 or 1.0 E.U./dL   Nitrite, UA Negative Negative   Leukocytes, UA Negative Negative  POCT Microscopic Urinalysis ([facility])  Result Value Ref Range   WBC,UR,HPF,POC Few (A) None WBC/hpf   RBC,UR,HPF,POC None None RBC/hpf   Bacteria Few (A) None, Too numerous to count   Mucus Absent Absent   Epithelial Cells, UR Per Microscopy None None, Too numerous to count cells/hpf  POCT CBC  Result Value Ref Range   WBC 7.3 4.6 - 10.2 K/uL   Lymph, poc 2.1 0.6 - 3.4   POC LYMPH PERCENT 28.3 10 - 50 %L    MID (cbc) 0.3 0 - 0.9   POC MID % 4.3 0 - 12 %M   POC Granulocyte 4.9 2 - 6.9   Granulocyte percent 67.4 37 - 80 %G   RBC 5.28 4.69 - 6.13 M/uL   Hemoglobin 16.1 14.1 - 18.1 g/dL   HCT, POC 54.0 98.1 - 53.7 %   MCV 90.5 80 - 97 fL   MCH, POC 30.4 27 - 31.2 pg   MCHC 33.6 31.8 - 35.4 g/dL   RDW, POC 19.1 %   Platelet Count, POC 248 142 - 424 K/uL   MPV 8.5 0 - 99.8 fL      Assessment & Plan:   Tanner Flores is  a 22 y.o. male Dysuria - Plan: POCT urinalysis dipstick, POCT Microscopic Urinalysis ([facility]), Urine Culture  RUQ abdominal pain - Plan: POCT CBC, US Abdomen Limited RUQ, Comprehensive metabolic panel, CANCELED: Comprehensive metabolic panel  Nausea and vomiting, intractability of vomiting not specified, unspecified vomiting type - Plan: ondansetron (ZOFRAN ODT) 4 MG disintegrating tablet  4-day history of nausea vomiting, right upper quadrant abdominal pain, episodic dysuria.  Few WBCs, urinalysis, but location of pain and symptoms concerning for gallbladder versus acute hepatitis.  Reassuring CBC.  Few episodes of urine output this morning and appears well-hydrated.  -Check stat CMP, right upper quadrant ultrasound today, n.p.o. for now.  -Zofran if needed for nausea, further plan determined by results from today.  ER precautions if worsening Meds ordered this encounter  Medications  . ondansetron (ZOFRAN ODT) 4 MG disintegrating tablet    Sig: Take 1 tablet (4 mg total) by mouth every 8 (eight) hours as needed for nausea or vomiting.    Dispense:  10 tablet    Refill:  0   Patient Instructions    Zofran if needed for nausea/vomiting, avoid any meals for now until we see results of the ultrasound.  I will check liver function test as well based on the location of your pain.  There were a few infection fighting cells in the urine, but I will check a urine culture to see if that is infected.  Depending on results of testing today, will determine next step.  If worsening  symptoms please proceed to the emergency room.   Nausea and Vomiting, Adult Nausea is the feeling that you have an upset stomach or have to vomit. As nausea gets worse, it can lead to vomiting. Vomiting occurs when stomach contents are thrown up and out of the mouth. Vomiting can make you feel weak and cause you to become dehydrated. Dehydration can make you tired and thirsty, cause you to have a dry mouth, and decrease how often you urinate. Older adults and people with other diseases or a weak immune system are at higher risk for dehydration. It is important to treat your nausea and vomiting as told by your health care provider. Follow these instructions at home: Follow instructions from your health care provider about how to care for yourself at home. Eating and drinking Follow these recommendations as told by your health care provider:  Take an oral rehydration solution (ORS). This is a drink that is sold at pharmacies and retail stores.  Drink clear fluids in small amounts as you are able. Clear fluids include water, ice chips, diluted fruit juice, and low-calorie sports drinks.  Eat bland, easy-to-digest foods in small amounts as you are able. These foods include bananas, applesauce, rice, lean meats, toast, and crackers.  Avoid fluids that contain a lot of sugar or caffeine, such as energy drinks, sports drinks, and soda.  Avoid alcohol.  Avoid spicy or fatty foods.  General instructions  Drink enough fluid to keep your urine clear or pale yellow.  Wash your hands often. If soap and water are not available, use hand sanitizer.  Make sure that all people in your household wash their hands well and often.  Take over-the-counter and prescription medicines only as told by your health care provider.  Rest at home while you recover.  Watch your condition for any changes.  Breathe slowly and deeply when you feel nauseated.  Keep all follow-up visits as told by your health care  provider. This is important. Contact a  health care provider if:  You have a fever.  You cannot keep fluids down.  Your symptoms get worse.  You have new symptoms.  Your nausea does not go away after two days.  You feel light-headed or dizzy.  You have a headache.  You have muscle cramps. Get help right away if:  You have pain in your chest, neck, arm, or jaw.  You feel extremely weak or you faint.  You have persistent vomiting.  You see blood in your vomit.  Your vomit looks like black coffee grounds.  You have bloody or black stools or stools that look like tar.  You have a severe headache, a stiff neck, or both.  You have a rash.  You have severe pain, cramping, or bloating in your abdomen.  You have trouble breathing or you are breathing very quickly.  Your heart is beating very quickly.  Your skin feels cold and clammy.  You feel confused.  You have pain when you urinate.  You have signs of dehydration, such as: ? Dark urine, very little urine, or no urine. ? Cracked lips. ? Dry mouth. ? Sunken eyes. ? Sleepiness. ? Weakness. These symptoms may represent a serious problem that is an emergency. Do not wait to see if the symptoms will go away. Get medical help right away. Call your local emergency services (911 in the [place] Do not drive yourself to the hospital. This information is not intended to replace advice given to you by your health care provider. Make sure you discuss any questions you have with your health care provider. Document Released: 05/20/2005 Document Revised: 10/23/2015 Document Reviewed: 01/24/2015 Elsevier Interactive Patient Education  2018 [employer]  Abdominal Pain, Adult Abdominal pain can be caused by many things. Often, abdominal pain is not serious and it gets better with no treatment or by being treated at home. However, sometimes abdominal pain is serious. Your health care provider will do a medical history and a  physical exam to try to determine the cause of your abdominal pain. Follow these instructions at home:  Take over-the-counter and prescription medicines only as told by your health care provider. Do not take a laxative unless told by your health care provider.  Drink enough fluid to keep your urine clear or pale yellow.  Watch your condition for any changes.  Keep all follow-up visits as told by your health care provider. This is important. Contact a health care provider if:  Your abdominal pain changes or gets worse.  You are not hungry or you lose weight without trying.  You are constipated or have diarrhea for more than 2-3 days.  You have pain when you urinate or have a bowel movement.  Your abdominal pain wakes you up at night.  Your pain gets worse with meals, after eating, or with certain foods.  You are throwing up and cannot keep anything down.  You have a fever. Get help right away if:  Your pain does not go away as soon as your health care provider told you to expect.  You cannot stop throwing up.  Your pain is only in areas of the abdomen, such as the right side or the left lower portion of the abdomen.  You have bloody or black stools, or stools that look like tar.  You have severe pain, cramping, or bloating in your abdomen.  You have signs of dehydration, such as: ? Dark urine, very little urine, or no urine. ? Cracked lips. ? Dry  mouth. ? Sunken eyes. ? Sleepiness. ? Weakness. This information is not intended to replace advice given to you by your health care provider. Make sure you discuss any questions you have with your health care provider. Document Released: 02/27/2005 Document Revised: 12/08/2015 Document Reviewed: 11/01/2015 Elsevier Interactive Patient Education  [employer].    If you have lab work done today you will be contacted with your lab results within the next 2 weeks.  If you have not heard from [place] then please contact us.  The fastest way to get your results is to register for My Chart.   IF you received an x-ray today, you will receive an invoice from [facility]. Please contact [facility] at [phone] with questions or concerns regarding your invoice.   IF you received labwork today, you will receive an invoice from [facility]. Please contact [facility] at [phone] with questions or concerns regarding your invoice.   Our billing staff will not be able to assist you with questions regarding bills from these companies.  You will be contacted with the lab results as soon as they are available. The fastest way to get your results is to activate your My Chart account. Instructions are located on the last page of this paperwork. If you have not heard from [place] regarding the results in 2 weeks, please contact this office.       I personally performed the services described in this documentation, which was scribed in my presence. The recorded information has been reviewed and considered for accuracy and completeness, addended by me as needed, and agree with information above.  Signed,   Meredith Staggers, MD Primary Care at [facility].  02/05/18 10:59 AM

## 2018-02-05 NOTE — Patient Instructions (Addendum)
Zofran if needed for nausea/vomiting, avoid any meals for now until we see results of the ultrasound.  I will check liver function test as well based on the location of your pain.  There were a few infection fighting cells in the urine, but I will check a urine culture to see if that is infected.  Depending on results of testing today, will determine next step.  If worsening symptoms please proceed to the emergency room.   Nausea and Vomiting, Adult Nausea is the feeling that you have an upset stomach or have to vomit. As nausea gets worse, it can lead to vomiting. Vomiting occurs when stomach contents are thrown up and out of the mouth. Vomiting can make you feel weak and cause you to become dehydrated. Dehydration can make you tired and thirsty, cause you to have a dry mouth, and decrease how often you urinate. Older adults and people with other diseases or a weak immune system are at higher risk for dehydration. It is important to treat your nausea and vomiting as told by your health care provider. Follow these instructions at home: Follow instructions from your health care provider about how to care for yourself at home. Eating and drinking Follow these recommendations as told by your health care provider:  Take an oral rehydration solution (ORS). This is a drink that is sold at pharmacies and retail stores.  Drink clear fluids in small amounts as you are able. Clear fluids include water, ice chips, diluted fruit juice, and low-calorie sports drinks.  Eat bland, easy-to-digest foods in small amounts as you are able. These foods include bananas, applesauce, rice, lean meats, toast, and crackers.  Avoid fluids that contain a lot of sugar or caffeine, such as energy drinks, sports drinks, and soda.  Avoid alcohol.  Avoid spicy or fatty foods.  General instructions  Drink enough fluid to keep your urine clear or pale yellow.  Wash your hands often. If soap and water are not available, use  hand sanitizer.  Make sure that all people in your household wash their hands well and often.  Take over-the-counter and prescription medicines only as told by your health care provider.  Rest at home while you recover.  Watch your condition for any changes.  Breathe slowly and deeply when you feel nauseated.  Keep all follow-up visits as told by your health care provider. This is important. Contact a health care provider if:  You have a fever.  You cannot keep fluids down.  Your symptoms get worse.  You have new symptoms.  Your nausea does not go away after two days.  You feel light-headed or dizzy.  You have a headache.  You have muscle cramps. Get help right away if:  You have pain in your chest, neck, arm, or jaw.  You feel extremely weak or you faint.  You have persistent vomiting.  You see blood in your vomit.  Your vomit looks like black coffee grounds.  You have bloody or black stools or stools that look like tar.  You have a severe headache, a stiff neck, or both.  You have a rash.  You have severe pain, cramping, or bloating in your abdomen.  You have trouble breathing or you are breathing very quickly.  Your heart is beating very quickly.  Your skin feels cold and clammy.  You feel confused.  You have pain when you urinate.  You have signs of dehydration, such as: ? Dark urine, very little urine, or no  urine. ? Cracked lips. ? Dry mouth. ? Sunken eyes. ? Sleepiness. ? Weakness. These symptoms may represent a serious problem that is an emergency. Do not wait to see if the symptoms will go away. Get medical help right away. Call your local emergency services (911 in the [place] Do not drive yourself to the hospital. This information is not intended to replace advice given to you by your health care provider. Make sure you discuss any questions you have with your health care provider. Document Released: 05/20/2005 Document Revised:  10/23/2015 Document Reviewed: 01/24/2015 Elsevier Interactive Patient Education  2018 [employer]vier [employer]  Abdominal Pain, Adult Abdominal pain can be caused by many things. Often, abdominal pain is not serious and it gets better with no treatment or by being treated at home. However, sometimes abdominal pain is serious. Your health care provider will do a medical history and a physical exam to try to determine the cause of your abdominal pain. Follow these instructions at home:  Take over-the-counter and prescription medicines only as told by your health care provider. Do not take a laxative unless told by your health care provider.  Drink enough fluid to keep your urine clear or pale yellow.  Watch your condition for any changes.  Keep all follow-up visits as told by your health care provider. This is important. Contact a health care provider if:  Your abdominal pain changes or gets worse.  You are not hungry or you lose weight without trying.  You are constipated or have diarrhea for more than 2-3 days.  You have pain when you urinate or have a bowel movement.  Your abdominal pain wakes you up at night.  Your pain gets worse with meals, after eating, or with certain foods.  You are throwing up and cannot keep anything down.  You have a fever. Get help right away if:  Your pain does not go away as soon as your health care provider told you to expect.  You cannot stop throwing up.  Your pain is only in areas of the abdomen, such as the right side or the left lower portion of the abdomen.  You have bloody or black stools, or stools that look like tar.  You have severe pain, cramping, or bloating in your abdomen.  You have signs of dehydration, such as: ? Dark urine, very little urine, or no urine. ? Cracked lips. ? Dry mouth. ? Sunken eyes. ? Sleepiness. ? Weakness. This information is not intended to replace advice given to you by your health care provider. Make sure you  discuss any questions you have with your health care provider. Document Released: 02/27/2005 Document Revised: 12/08/2015 Document Reviewed: 11/01/2015 Elsevier Interactive Patient Education  [employer].    If you have lab work done today you will be contacted with your lab results within the next 2 weeks.  If you have not heard from [place] then please contact us. The fastest way to get your results is to register for My Chart.   IF you received an x-ray today, you will receive an invoice from [facility]. Please contact [facility] at [phone] with questions or concerns regarding your invoice.   IF you received labwork today, you will receive an invoice from [facility]. Please contact [facility] at [phone] with questions or concerns regarding your invoice.   Our billing staff will not be able to assist you with questions regarding bills from these companies.  You will be contacted with the lab results as  soon as they are available. The fastest way to get your results is to activate your My Chart account. Instructions are located on the last page of this paperwork. If you have not heard from [place] regarding the results in 2 weeks, please contact this office.

## 2018-02-05 NOTE — Addendum Note (Signed)
Addended by: Meredith Staggers R on: 02/05/2018 12:33 PM   Modules accepted: Orders

## 2018-02-06 LAB — URINE CULTURE: Organism ID, Bacteria: NO GROWTH

## 2018-02-13 ENCOUNTER — Ambulatory Visit: Payer: 59 | Admitting: Physician Assistant

## 2018-02-18 ENCOUNTER — Ambulatory Visit: Payer: 59 | Admitting: Physician Assistant

## 2018-06-03 IMAGING — CR DG CHEST 2V
2 series · 2 of 2 positions shown · non-contrast
Comparison: None.

CLINICAL DATA: 21-year-old male with hematemesis

EXAM:
CHEST  2 VIEW

[chest ap]
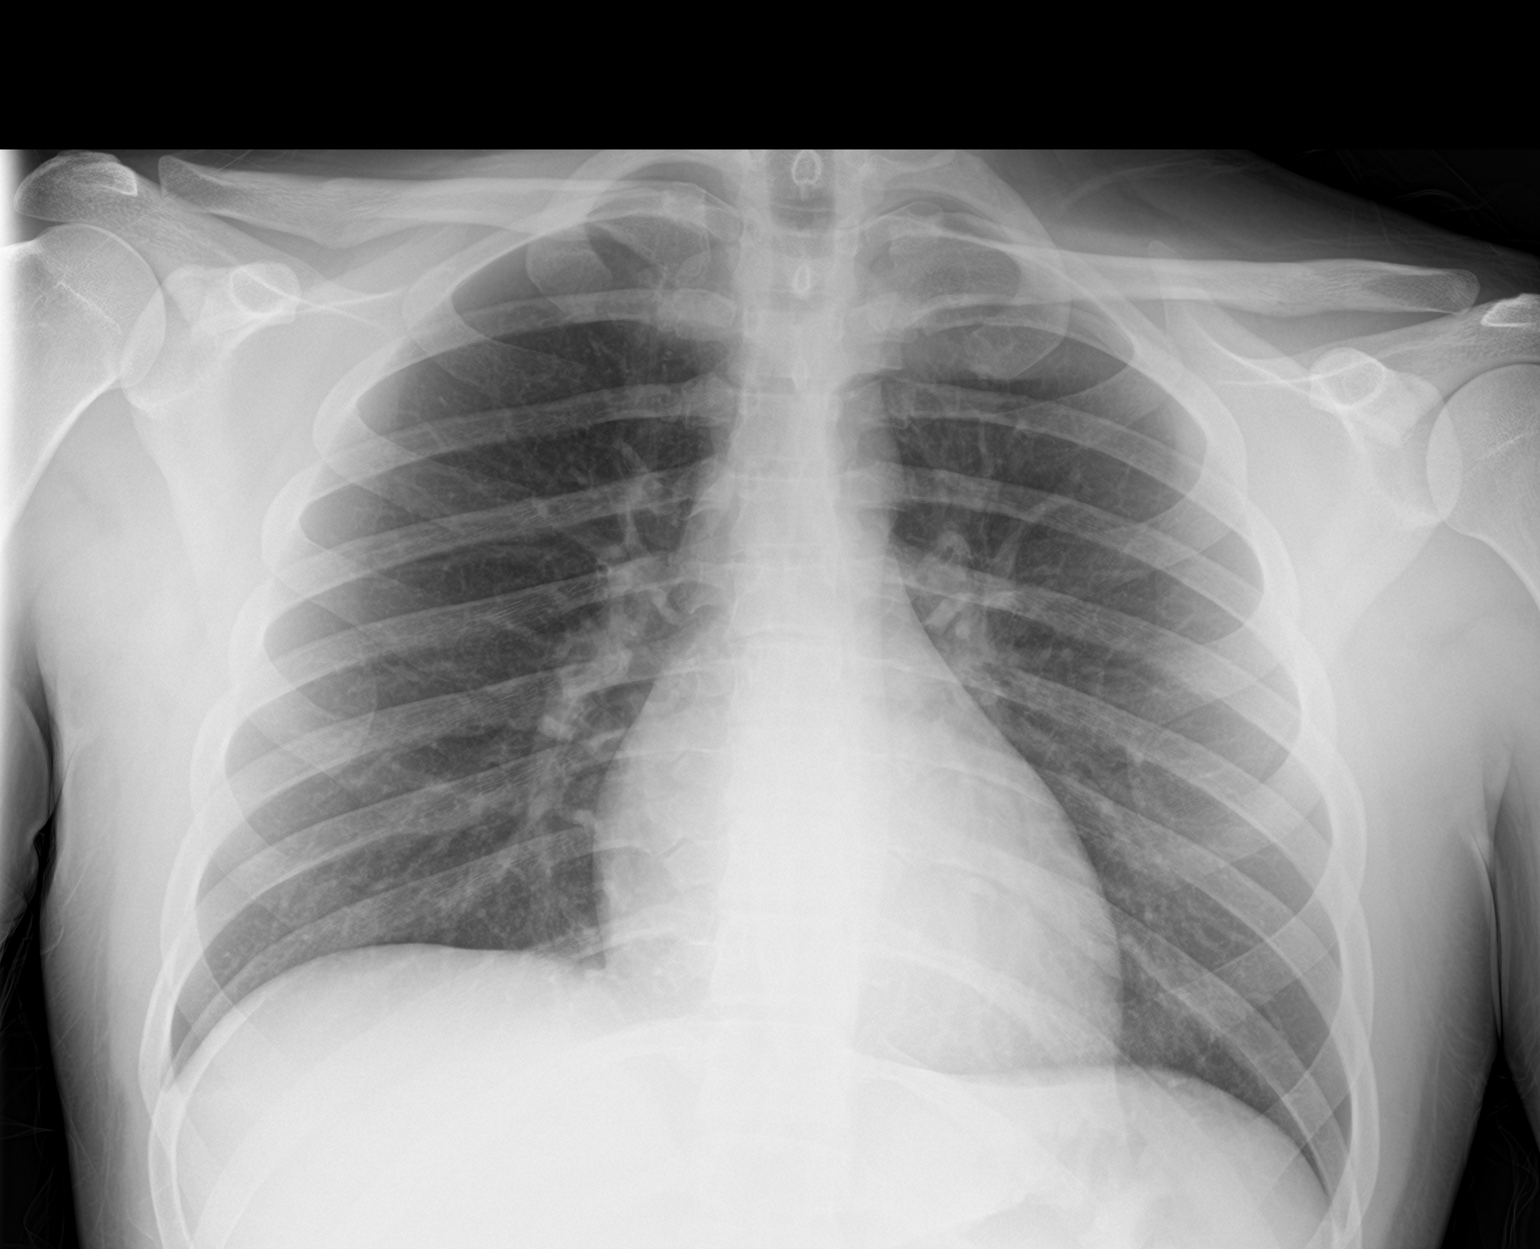

[chest lat]
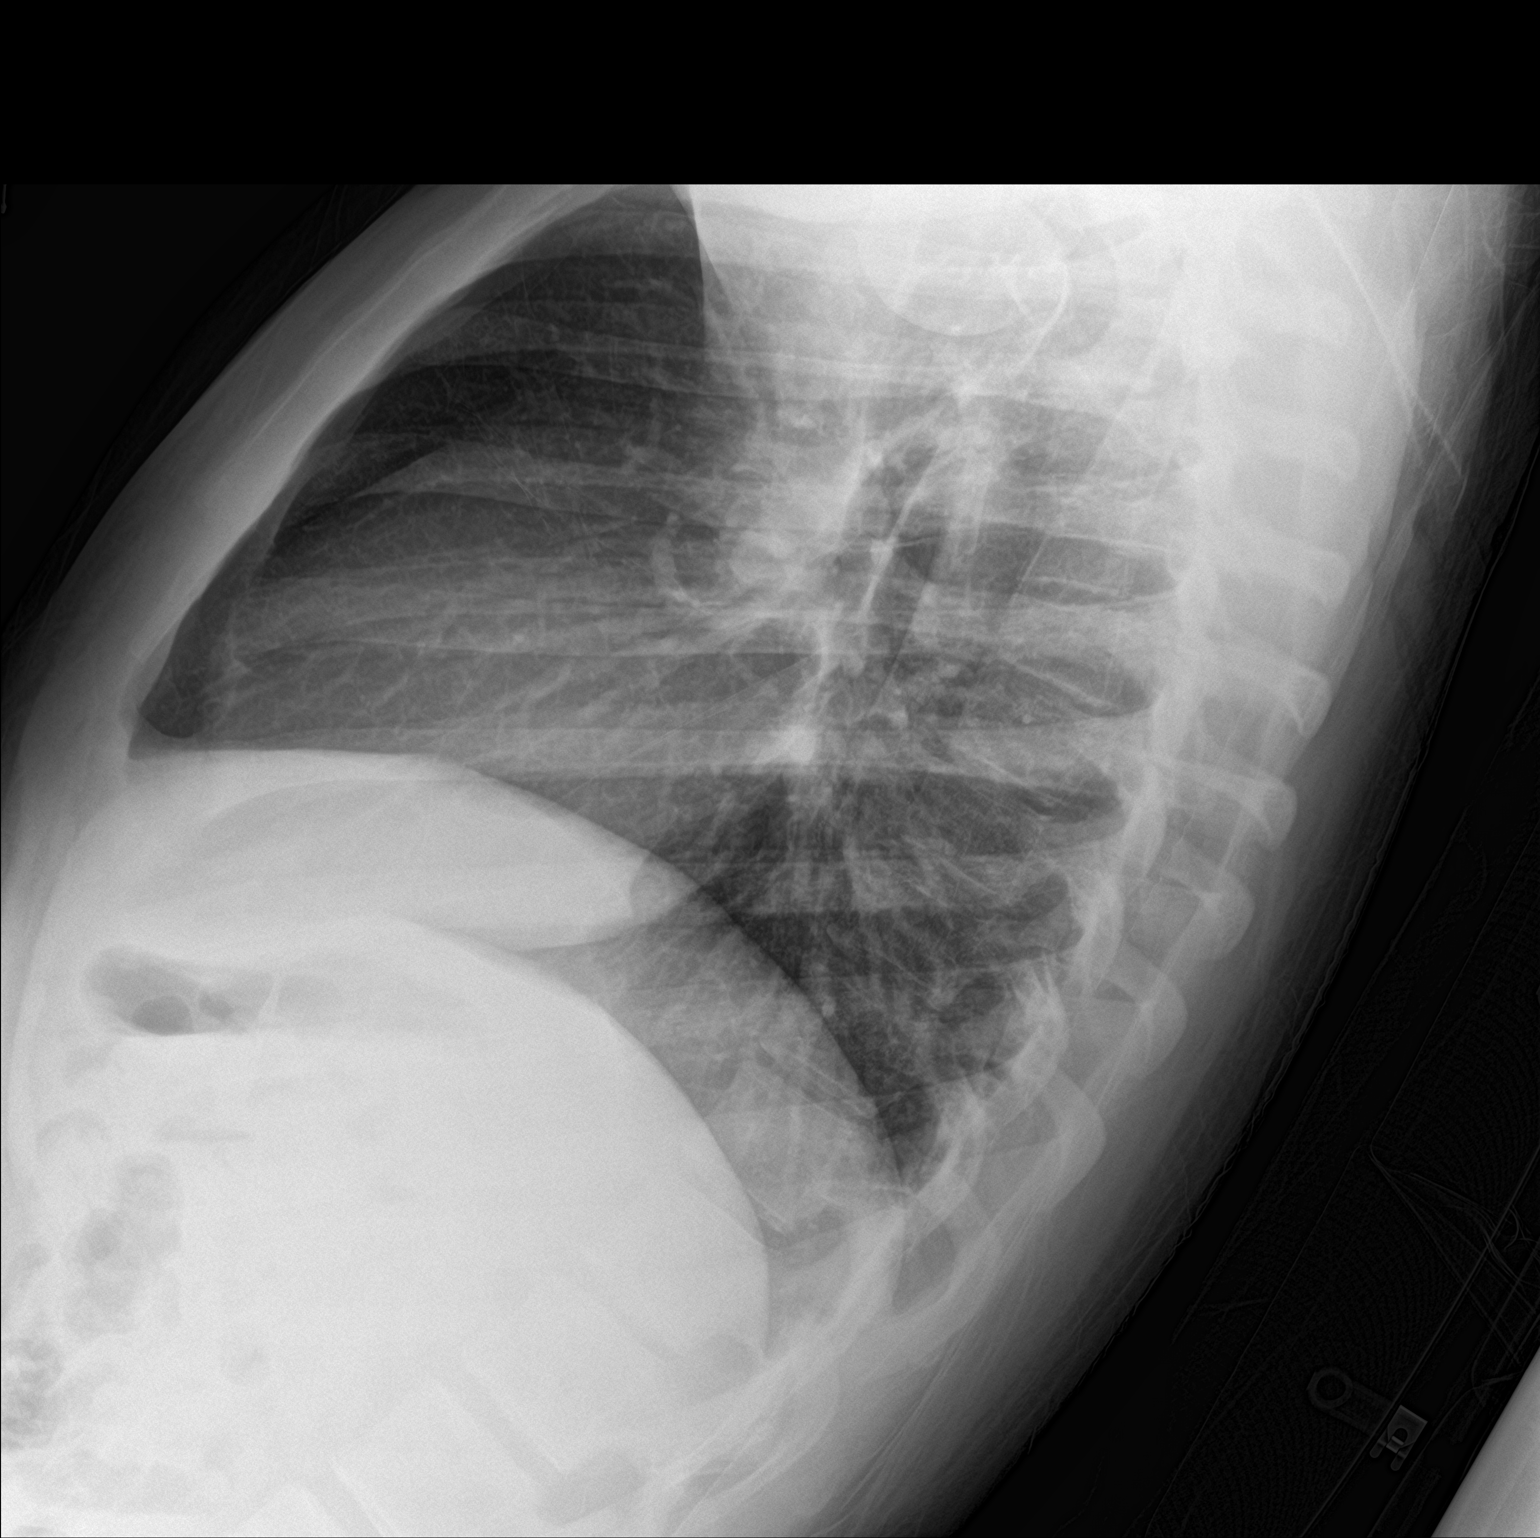

[2 of 2 positions shown; findings below may reference images not displayed]

FINDINGS: The lungs are clear and negative for focal airspace consolidation,
pulmonary edema or suspicious pulmonary nodule. No pleural effusion
or pneumothorax. Cardiac and mediastinal contours are within normal
limits. No acute fracture or lytic or blastic osseous lesions. The
visualized upper abdominal bowel gas pattern is unremarkable.
IMPRESSION: Normal chest x-ray.

## 2019-07-13 ENCOUNTER — Other Ambulatory Visit: Payer: Self-pay

## 2019-07-13 ENCOUNTER — Encounter: Payer: Self-pay | Admitting: Registered Nurse

## 2019-07-13 ENCOUNTER — Ambulatory Visit: Payer: 59 | Admitting: Registered Nurse

## 2019-07-13 VITALS — BP 118/78 | HR 94 | Temp 98.8°F | Ht 71.0 in | Wt 169.2 lb

## 2019-07-13 DIAGNOSIS — Z1329 Encounter for screening for other suspected endocrine disorder: Secondary | ICD-10-CM

## 2019-07-13 DIAGNOSIS — Z13220 Encounter for screening for lipoid disorders: Secondary | ICD-10-CM

## 2019-07-13 DIAGNOSIS — F411 Generalized anxiety disorder: Secondary | ICD-10-CM | POA: Diagnosis not present

## 2019-07-13 DIAGNOSIS — F329 Major depressive disorder, single episode, unspecified: Secondary | ICD-10-CM | POA: Diagnosis not present

## 2019-07-13 DIAGNOSIS — Z130 Encounter for screening for diseases of the blood and blood-forming organs and certain disorders involving the immune mechanism: Secondary | ICD-10-CM

## 2019-07-13 DIAGNOSIS — Z13228 Encounter for screening for other metabolic disorders: Secondary | ICD-10-CM

## 2019-07-13 MED ORDER — ESCITALOPRAM OXALATE 10 MG PO TABS: 10.0000 mg | ORAL_TABLET | Freq: Every day | ORAL | 0 refills | Status: DC

## 2019-07-13 MED ORDER — ESCITALOPRAM OXALATE 5 MG PO TABS: 5.0000 mg | ORAL_TABLET | Freq: Every day | ORAL | 0 refills | Status: DC

## 2019-07-14 LAB — CBC WITH DIFFERENTIAL/PLATELET
Basophils Absolute: 0 x10E3/uL (ref 0.0–0.2)
Basos: 1 %
EOS (ABSOLUTE): 0.2 x10E3/uL (ref 0.0–0.4)
Eos: 3 %
Hematocrit: 46 % (ref 37.5–51.0)
Hemoglobin: 15.7 g/dL (ref 13.0–17.7)
Immature Grans (Abs): 0 x10E3/uL (ref 0.0–0.1)
Immature Granulocytes: 1 %
Lymphocytes Absolute: 2 x10E3/uL (ref 0.7–3.1)
Lymphs: 33 %
MCH: 30.5 pg (ref 26.6–33.0)
MCHC: 34.1 g/dL (ref 31.5–35.7)
MCV: 90 fL (ref 79–97)
Monocytes Absolute: 0.6 x10E3/uL (ref 0.1–0.9)
Monocytes: 9 %
Neutrophils Absolute: 3.3 x10E3/uL (ref 1.4–7.0)
Neutrophils: 53 %
Platelets: 261 x10E3/uL (ref 150–450)
RBC: 5.14 x10E6/uL (ref 4.14–5.80)
RDW: 12.9 % (ref 11.6–15.4)
WBC: 6 x10E3/uL (ref 3.4–10.8)

## 2019-07-14 LAB — LIPID PANEL
Chol/HDL Ratio: 3.1 ratio (ref 0.0–5.0)
Cholesterol, Total: 159 mg/dL (ref 100–199)
HDL: 51 mg/dL
LDL Chol Calc (NIH): 88 mg/dL (ref 0–99)
Triglycerides: 110 mg/dL (ref 0–149)
VLDL Cholesterol Cal: 20 mg/dL (ref 5–40)

## 2019-07-14 LAB — COMPREHENSIVE METABOLIC PANEL
ALT: 15 IU/L (ref 0–44)
AST: 18 IU/L (ref 0–40)
Albumin/Globulin Ratio: 2.8 — ABNORMAL HIGH (ref 1.2–2.2)
Albumin: 4.4 g/dL (ref 4.1–5.2)
Alkaline Phosphatase: 91 IU/L (ref 39–117)
BUN/Creatinine Ratio: 11 (ref 9–20)
BUN: 12 mg/dL (ref 6–20)
Bilirubin Total: 0.4 mg/dL (ref 0.0–1.2)
CO2: 25 mmol/L (ref 20–29)
Calcium: 9.2 mg/dL (ref 8.7–10.2)
Chloride: 102 mmol/L (ref 96–106)
Creatinine, Ser: 1.05 mg/dL (ref 0.76–1.27)
GFR calc Af Amer: 116 mL/min/{1.73_m2} (ref >59)
GFR calc non Af Amer: 100 mL/min/{1.73_m2} (ref >59)
Globulin, Total: 1.6 g/dL (ref 1.5–4.5)
Glucose: 77 mg/dL (ref 65–99)
Potassium: 4.5 mmol/L (ref 3.5–5.2)
Sodium: 141 mmol/L (ref 134–144)
Total Protein: 6 g/dL (ref 6.0–8.5)

## 2019-07-14 LAB — TSH: TSH: 1.04 u[IU]/mL (ref 0.450–4.500)

## 2019-07-16 ENCOUNTER — Encounter: Payer: Self-pay | Admitting: Radiology

## 2019-07-16 NOTE — Progress Notes (Signed)
Good morning,  Normal results letter please!   Thank you,  Jari Sportsman, NP

## 2019-07-20 NOTE — Progress Notes (Signed)
New Patient Office Visit  Subjective:  Patient ID: Tanner Flores, male    DOB: [birth year]  Age: 24 y.o. MRN: [id number]  CC:  Chief Complaint  Patient presents with  . Transitions Of Care    toc . patient states he was on alot of pain killers in the past went to rehab and 7 months clean and havent even taken his medication for depression , but thinks he need something for anxiety and some depression. 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   Had been on an antidepressant/anxiolytic medication, but has run out. Felt like these symptoms were under control, but unfortunately, they are worsening Denies HI/SI.  Interested in starting a new medication but wary given his hx of rx medication misuse.   Past Medical History:  Diagnosis Date  . Right knee pain 01/02/2017    Past Surgical History:  Procedure Laterality Date  . LAPAROSCOPIC APPENDECTOMY N/A 07/18/2016   Procedure: APPENDECTOMY LAPAROSCOPIC;  Surgeon: Jackolyn Confer, MD;  Location: [facility];  Service: General;  Laterality: N/A;  . WISDOM TOOTH EXTRACTION  04/2016   x4    Family History  Problem Relation Age of Onset  . Diabetes Maternal Grandmother   . Lung cancer Maternal Grandmother   . Heart disease Maternal Grandfather   . Lung cancer Maternal Grandfather   . Diabetes Paternal Grandmother     Social History   Socioeconomic History  . Marital status: Single    Spouse name: Not on file  . Number of children: Not on file  . Years of education: 5  . Highest education level: Not on file  Occupational History  . Not on file  Tobacco Use  . Smoking status: Never Smoker  . Smokeless tobacco: Never Used  Substance and Sexual Activity  . Alcohol use: No  . Drug use: Not Currently  . Sexual activity: Not Currently  Other Topics Concern  . Not on file  Social History Narrative   Lives with parents   Caffeine use: 3  sodas per day   Social Determinants of Health   Financial Resource Strain:   . Difficulty of Paying Living Expenses: Not on file  Food Insecurity:   . Worried About Charity fundraiser in the Last Year: Not on file  . Ran Out of Food in the Last Year: Not on file  Transportation Needs:   . Lack of Transportation (Medical): Not on file  . Lack of Transportation (Non-Medical): Not on file  Physical Activity:   . Days of Exercise per Week: Not on file  . Minutes of Exercise per Session: Not on file  Stress:   . Feeling of Stress : Not on file  Social Connections:   . Frequency of Communication with Friends and Family: Not on file  . Frequency of Social Gatherings with Friends and Family: Not on file  . Attends Religious Services: Not on file  . Active Member of Clubs or Organizations: Not on file  . Attends Archivist Meetings: Not on file  . Marital Status: Not on file  Intimate Partner Violence:   . Fear of Current or Ex-Partner: Not on file  . Emotionally Abused: Not on file  . Physically Abused: Not on file  . Sexually Abused: Not on file    ROS Review of Systems  Constitutional: Negative.   HENT: Negative.   Eyes: Negative.   Respiratory: Negative.  Cardiovascular: Negative.   Gastrointestinal: Negative.   Endocrine: Negative.   Genitourinary: Negative.   Musculoskeletal: Negative.   Skin: Negative.   Allergic/Immunologic: Negative.   Hematological: Negative.   Psychiatric/Behavioral: Positive for decreased concentration, dysphoric mood and sleep disturbance. Negative for agitation, behavioral problems, confusion, hallucinations, self-injury and suicidal ideas. The patient is nervous/anxious. The patient is not hyperactive.   All other systems reviewed and are negative.   Objective:   Today's Vitals: BP 118/78   Pulse 94   Temp 98.8 F (37.1 C) (Temporal)   Ht 5\' 11"  (1.803 m)   Wt 169 lb 3.2 oz (76.7 kg)   SpO2 98%   BMI 23.60 kg/m   Physical  Exam Vitals and nursing note reviewed.  Constitutional:      General: He is not in acute distress.    Appearance: Normal appearance. He is normal weight. He is not ill-appearing, toxic-appearing or diaphoretic.  Cardiovascular:     Rate and Rhythm: Normal rate and regular rhythm.  Pulmonary:     Effort: Pulmonary effort is normal. No respiratory distress.  Skin:    General: Skin is warm and dry.     Capillary Refill: Capillary refill takes less than 2 seconds.     Coloration: Skin is not jaundiced or pale.     Findings: No bruising, erythema, lesion or rash.  Neurological:     General: No focal deficit present.     Mental Status: He is alert and oriented to person, place, and time. Mental status is at baseline.  Psychiatric:        Mood and Affect: Mood normal.        Behavior: Behavior normal.        Thought Content: Thought content normal.        Judgment: Judgment normal.     Assessment & Plan:   Problem List Items Addressed This Visit      Other   Major depressive disorder with current active episode   Relevant Medications   escitalopram (LEXAPRO) 5 MG tablet   escitalopram (LEXAPRO) 10 MG tablet    Other Visit Diagnoses    GAD (generalized anxiety disorder)    -  Primary   Relevant Medications   escitalopram (LEXAPRO) 5 MG tablet   escitalopram (LEXAPRO) 10 MG tablet   Screening for endocrine, metabolic and immunity disorder       Relevant Orders   TSH (Completed)   CBC with Differential (Completed)   Comprehensive metabolic panel (Completed)   Lipid screening       Relevant Orders   Lipid Panel (Completed)      Outpatient Encounter Medications as of 07/13/2019  Medication Sig  . escitalopram (LEXAPRO) 10 MG tablet Take 1 tablet (10 mg total) by mouth daily. Dose may be increased in 10 mg increments after 2-3 weeks based on response. Max dose 20-30 mg  . escitalopram (LEXAPRO) 5 MG tablet Take 1 tablet (5 mg total) by mouth daily.  . ondansetron (ZOFRAN ODT)  4 MG disintegrating tablet Take 1 tablet (4 mg total) by mouth every 8 (eight) hours as needed for nausea or vomiting. (Patient not taking: Reported on 07/13/2019)  . [DISCONTINUED] escitalopram (LEXAPRO) 10 MG tablet Take 1 tablet (10 mg total) by mouth daily. Dose may be increased in 10 mg increments after 2-3 weeks based on response. Max dose 20-30 mg (Patient not taking: Reported on 07/13/2019)   No facility-administered encounter medications on file as of 07/13/2019.    Follow-up: No  follow-ups on file.   PLAN  Start escitalopram 5mg  PO qd for one week, then increase to 10mg  PO qd, med check in 4-6 weeks  Labs drawn, will follow up as warranted  Will focus on titration of medications to best dose in coming weeks  Encouraged patient to follow up with counseling  Patient encouraged to call clinic with any questions, comments, or concerns.  , NP

## 2019-08-10 ENCOUNTER — Ambulatory Visit: Payer: 59 | Admitting: Registered Nurse

## 2019-08-10 ENCOUNTER — Other Ambulatory Visit: Payer: Self-pay

## 2019-08-10 ENCOUNTER — Encounter: Payer: Self-pay | Admitting: Registered Nurse

## 2019-08-10 VITALS — BP 121/68 | HR 79 | Temp 98.4°F | Ht 72.0 in | Wt 169.2 lb

## 2019-08-10 DIAGNOSIS — F5104 Psychophysiologic insomnia: Secondary | ICD-10-CM | POA: Diagnosis not present

## 2019-08-10 DIAGNOSIS — F411 Generalized anxiety disorder: Secondary | ICD-10-CM | POA: Insufficient documentation

## 2019-08-10 MED ORDER — ESCITALOPRAM OXALATE 20 MG PO TABS: 20.0000 mg | ORAL_TABLET | Freq: Every day | ORAL | 0 refills | Status: DC

## 2019-08-10 MED ORDER — TRAZODONE HCL 50 MG PO TABS: 25.0000 mg | ORAL_TABLET | Freq: Every evening | ORAL | 3 refills | Status: DC | PRN

## 2019-08-10 NOTE — Patient Instructions (Signed)
° ° ° °  If you have lab work done today you will be contacted with your lab results within the next 2 weeks.  If you have not heard from us then please contact us. The fastest way to get your results is to register for My Chart. ° ° °IF you received an x-ray today, you will receive an invoice from [facility]. Please contact [facility] at [phone] with questions or concerns regarding your invoice.  ° °IF you received labwork today, you will receive an invoice from [facility]. Please contact [facility] at [phone] with questions or concerns regarding your invoice.  ° °Our billing staff will not be able to assist you with questions regarding bills from these companies. ° °You will be contacted with the lab results as soon as they are available. The fastest way to get your results is to activate your My Chart account. Instructions are located on the last page of this paperwork. If you have not heard from us regarding the results in 2 weeks, please contact this office. °  ° ° ° °

## 2019-08-10 NOTE — Progress Notes (Signed)
Established Patient Office Visit  Subjective:  Patient ID: Tanner Flores, male    DOB: 10-08-1995  Age: 24 y.o. MRN: [id number]  CC:  Chief Complaint  Patient presents with  . Follow-up    on his medical conditions no other concerns     HPI Tanner Flores presents for med check on escitalopram  Has noticed some improvement - moods somewhat more stable, less extreme. He has been taking 10mg  PO qd  Otherwise, just noticing some sleep disturbance. Denies apnea and snoring. States he falls asleep easily but does not stay asleep. Tosses and turns, wakes with some anxiety, feels he can't get comfortable.  Otherwise no concerns.  Past Medical History:  Diagnosis Date  . Right knee pain 01/02/2017    Past Surgical History:  Procedure Laterality Date  . LAPAROSCOPIC APPENDECTOMY N/A 07/18/2016   Procedure: APPENDECTOMY LAPAROSCOPIC;  Surgeon: 07/20/2016, MD;  Location: [facility];  Service: General;  Laterality: N/A;  . WISDOM TOOTH EXTRACTION  04/2016   x4    Family History  Problem Relation Age of Onset  . Diabetes Maternal Grandmother   . Lung cancer Maternal Grandmother   . Heart disease Maternal Grandfather   . Lung cancer Maternal Grandfather   . Diabetes Paternal Grandmother     Social History   Socioeconomic History  . Marital status: Single    Spouse name: Not on file  . Number of children: Not on file  . Years of education: 13  . Highest education level: Not on file  Occupational History  . Not on file  Tobacco Use  . Smoking status: Never Smoker  . Smokeless tobacco: Never Used  Substance and Sexual Activity  . Alcohol use: No  . Drug use: Not Currently  . Sexual activity: Not Currently  Other Topics Concern  . Not on file  Social History Narrative   Lives with parents   Caffeine use: 3 sodas per day   Social Determinants of Health   Financial Resource Strain:   . Difficulty of Paying Living Expenses: Not on file  Food Insecurity:   .  Worried About 14 in the Last Year: Not on file  . Ran Out of Food in the Last Year: Not on file  Transportation Needs:   . Lack of Transportation (Medical): Not on file  . Lack of Transportation (Non-Medical): Not on file  Physical Activity:   . Days of Exercise per Week: Not on file  . Minutes of Exercise per Session: Not on file  Stress:   . Feeling of Stress : Not on file  Social Connections:   . Frequency of Communication with Friends and Family: Not on file  . Frequency of Social Gatherings with Friends and Family: Not on file  . Attends Religious Services: Not on file  . Active Member of Clubs or Organizations: Not on file  . Attends Programme researcher, broadcasting/film/video Meetings: Not on file  . Marital Status: Not on file  Intimate Partner Violence:   . Fear of Current or Ex-Partner: Not on file  . Emotionally Abused: Not on file  . Physically Abused: Not on file  . Sexually Abused: Not on file    Outpatient Medications Prior to Visit  Medication Sig Dispense Refill  . escitalopram (LEXAPRO) 10 MG tablet Take 1 tablet (10 mg total) by mouth daily. Dose may be increased in 10 mg increments after 2-3 weeks based on response. Max dose 20-30 mg 60 tablet 0  . escitalopram (  LEXAPRO) 5 MG tablet Take 1 tablet (5 mg total) by mouth daily. 7 tablet 0  . ondansetron (ZOFRAN ODT) 4 MG disintegrating tablet Take 1 tablet (4 mg total) by mouth every 8 (eight) hours as needed for nausea or vomiting. (Patient not taking: Reported on 07/13/2019) 10 tablet 0   No facility-administered medications prior to visit.    No Known Allergies  ROS Review of Systems  Constitutional: Negative.   HENT: Negative.   Eyes: Negative.   Respiratory: Negative.   Gastrointestinal: Negative.   Endocrine: Negative.   Genitourinary: Negative.   Musculoskeletal: Negative.   Skin: Negative.   Allergic/Immunologic: Negative.   Neurological: Negative.   Hematological: Negative.   Psychiatric/Behavioral:  Positive for sleep disturbance. Negative for agitation, behavioral problems, confusion, decreased concentration, dysphoric mood, hallucinations, self-injury and suicidal ideas. The patient is nervous/anxious. The patient is not hyperactive.   All other systems reviewed and are negative.     Objective:    Physical Exam  Constitutional: He is oriented to person, place, and time. He appears well-developed and well-nourished. No distress.  Cardiovascular: Normal rate and regular rhythm.  Pulmonary/Chest: Effort normal. No respiratory distress.  Neurological: He is alert and oriented to person, place, and time. No cranial nerve deficit.  Skin: Skin is warm and dry. He is not diaphoretic. No erythema. No pallor.  Psychiatric: He has a normal mood and affect. His behavior is normal. Judgment and thought content normal.  Nursing note and vitals reviewed.   BP 121/68   Pulse 79   Temp 98.4 F (36.9 C) (Temporal)   Ht 6' (1.829 m)   Wt 169 lb 3.2 oz (76.7 kg)   SpO2 97%   BMI 22.95 kg/m  Wt Readings from Last 3 Encounters:  08/10/19 169 lb 3.2 oz (76.7 kg)  07/13/19 169 lb 3.2 oz (76.7 kg)  02/05/18 167 lb 9.6 oz (76 kg)     There are no preventive care reminders to display for this patient.  There are no preventive care reminders to display for this patient.  Lab Results  Component Value Date   TSH 1.040 07/13/2019   Lab Results  Component Value Date   WBC 6.0 07/13/2019   HGB 15.7 07/13/2019   HCT 46.0 07/13/2019   MCV 90 07/13/2019   PLT 261 07/13/2019   Lab Results  Component Value Date   NA 141 07/13/2019   K 4.5 07/13/2019   CO2 25 07/13/2019   GLUCOSE 77 07/13/2019   BUN 12 07/13/2019   CREATININE 1.05 07/13/2019   BILITOT 0.4 07/13/2019   ALKPHOS 91 07/13/2019   AST 18 07/13/2019   ALT 15 07/13/2019   PROT 6.0 07/13/2019   ALBUMIN 4.4 07/13/2019   CALCIUM 9.2 07/13/2019   ANIONGAP 9 05/24/2017   Lab Results  Component Value Date   CHOL 159 07/13/2019     Lab Results  Component Value Date   HDL 51 07/13/2019   Lab Results  Component Value Date   LDLCALC 88 07/13/2019   Lab Results  Component Value Date   TRIG 110 07/13/2019   Lab Results  Component Value Date   CHOLHDL 3.1 07/13/2019   No results found for: HGBA1C    Assessment & Plan:   Problem List Items Addressed This Visit    None    Visit Diagnoses    Psychophysiological insomnia    -  Primary   Relevant Medications   traZODone (DESYREL) 50 MG tablet   GAD (generalized anxiety  disorder)       Relevant Medications   traZODone (DESYREL) 50 MG tablet   escitalopram (LEXAPRO) 20 MG tablet      Meds ordered this encounter  Medications  . traZODone (DESYREL) 50 MG tablet    Sig: Take 0.5-1 tablets (25-50 mg total) by mouth at bedtime as needed for sleep.    Dispense:  30 tablet    Refill:  3    Order Specific Question:   Supervising Provider    Answer:   Collie Siad A [id number]  . escitalopram (LEXAPRO) 20 MG tablet    Sig: Take 1 tablet (20 mg total) by mouth daily.    Dispense:  90 tablet    Refill:  0    Order Specific Question:   Supervising Provider    Answer:   Doristine Bosworth [id number]    Follow-up: No follow-ups on file.   PLAN  Increase escitalopram to 20mg  PO qd  Start trazodone 25-50mg  PO qhs PRN for sleep disturbance  Med check in 3 mo  Patient encouraged to call clinic with any questions, comments, or concerns.   , NP

## 2019-11-09 ENCOUNTER — Ambulatory Visit: Payer: 59 | Admitting: Registered Nurse

## 2019-11-09 ENCOUNTER — Encounter: Payer: Self-pay | Admitting: Registered Nurse

## 2019-11-09 ENCOUNTER — Other Ambulatory Visit: Payer: Self-pay

## 2019-11-09 DIAGNOSIS — F411 Generalized anxiety disorder: Secondary | ICD-10-CM | POA: Diagnosis not present

## 2019-11-09 MED ORDER — ESCITALOPRAM OXALATE 20 MG PO TABS: 20.0000 mg | ORAL_TABLET | Freq: Every day | ORAL | 3 refills | Status: DC

## 2019-11-09 NOTE — Patient Instructions (Signed)
° ° ° °  If you have lab work done today you will be contacted with your lab results within the next 2 weeks.  If you have not heard from us then please contact us. The fastest way to get your results is to register for My Chart. ° ° °IF you received an x-ray today, you will receive an invoice from [facility]. Please contact [facility] at [phone] with questions or concerns regarding your invoice.  ° °IF you received labwork today, you will receive an invoice from [facility]. Please contact [facility] at [phone] with questions or concerns regarding your invoice.  ° °Our billing staff will not be able to assist you with questions regarding bills from these companies. ° °You will be contacted with the lab results as soon as they are available. The fastest way to get your results is to activate your My Chart account. Instructions are located on the last page of this paperwork. If you have not heard from us regarding the results in 2 weeks, please contact this office. °  ° ° ° °

## 2020-01-31 ENCOUNTER — Telehealth: Payer: Self-pay | Admitting: Registered Nurse

## 2020-01-31 NOTE — Telephone Encounter (Signed)
Pt called and pharmacy stated pt needs refill faxed in. Pt does have 3 refills left on medication listed below.   escitalopram (LEXAPRO) 20 MG tablet [[id number]]   [id number] [place], [place] - [address]  Please advise.

## 2020-02-01 NOTE — Telephone Encounter (Signed)
Called and spoke to the pharmacy to about the medication but it was already ready to be picked up by the patient. Informed patient and he is heading to the pharmacy today.

## 2020-02-21 ENCOUNTER — Encounter: Payer: Self-pay | Admitting: Registered Nurse

## 2020-02-21 NOTE — Progress Notes (Signed)
Established Patient Office Visit  Subjective:  Patient ID: Tanner Flores, male    DOB: Feb 05, 1996  Age: 24 y.o. MRN: [id number]  CC:  Chief Complaint  Patient presents with  . Follow-up    patient is here for 3 month follow up on medicatio. Per patient he has seen alot of improvement but has a question about missing a dose.    HPI Tanner Flores presents for 3 mo follow up on escitalopram Feeling that his anxiety and depression has improved a lot - no new concerns Continues to deny hi/si.  No AEs  Missed a dose - concern about what to do when this happens.  Past Medical History:  Diagnosis Date  . Right knee pain 01/02/2017    Past Surgical History:  Procedure Laterality Date  . LAPAROSCOPIC APPENDECTOMY N/A 07/18/2016   Procedure: APPENDECTOMY LAPAROSCOPIC;  Surgeon: Avel Peace, MD;  Location: [facility];  Service: General;  Laterality: N/A;  . WISDOM TOOTH EXTRACTION  04/2016   x4    Family History  Problem Relation Age of Onset  . Diabetes Maternal Grandmother   . Lung cancer Maternal Grandmother   . Heart disease Maternal Grandfather   . Lung cancer Maternal Grandfather   . Diabetes Paternal Grandmother     Social History   Socioeconomic History  . Marital status: Single    Spouse name: Not on file  . Number of children: Not on file  . Years of education: 18  . Highest education level: Not on file  Occupational History  . Not on file  Tobacco Use  . Smoking status: Never Smoker  . Smokeless tobacco: Never Used  Vaping Use  . Vaping Use: Never used  Substance and Sexual Activity  . Alcohol use: No  . Drug use: Not Currently  . Sexual activity: Not Currently  Other Topics Concern  . Not on file  Social History Narrative   Lives with parents   Caffeine use: 3 sodas per day   Social Determinants of Health   Financial Resource Strain:   . Difficulty of Paying Living Expenses: Not on file  Food Insecurity:   . Worried About Programme researcher, broadcasting/film/video  in the Last Year: Not on file  . Ran Out of Food in the Last Year: Not on file  Transportation Needs:   . Lack of Transportation (Medical): Not on file  . Lack of Transportation (Non-Medical): Not on file  Physical Activity:   . Days of Exercise per Week: Not on file  . Minutes of Exercise per Session: Not on file  Stress:   . Feeling of Stress : Not on file  Social Connections:   . Frequency of Communication with Friends and Family: Not on file  . Frequency of Social Gatherings with Friends and Family: Not on file  . Attends Religious Services: Not on file  . Active Member of Clubs or Organizations: Not on file  . Attends Banker Meetings: Not on file  . Marital Status: Not on file  Intimate Partner Violence:   . Fear of Current or Ex-Partner: Not on file  . Emotionally Abused: Not on file  . Physically Abused: Not on file  . Sexually Abused: Not on file    Outpatient Medications Prior to Visit  Medication Sig Dispense Refill  . traZODone (DESYREL) 50 MG tablet Take 0.5-1 tablets (25-50 mg total) by mouth at bedtime as needed for sleep. 30 tablet 3  . escitalopram (LEXAPRO) 20 MG tablet Take 1  tablet (20 mg total) by mouth daily. 90 tablet 0  . ondansetron (ZOFRAN ODT) 4 MG disintegrating tablet Take 1 tablet (4 mg total) by mouth every 8 (eight) hours as needed for nausea or vomiting. (Patient not taking: Reported on 07/13/2019) 10 tablet 0   No facility-administered medications prior to visit.    No Known Allergies  ROS Review of Systems  Constitutional: Negative.   HENT: Negative.   Eyes: Negative.   Respiratory: Negative.   Cardiovascular: Negative.   Gastrointestinal: Negative.   Genitourinary: Negative.   Musculoskeletal: Negative.   Skin: Negative.   Neurological: Negative.   Psychiatric/Behavioral: Negative.       Objective:    Physical Exam Constitutional:      General: He is not in acute distress.    Appearance: Normal appearance. He is  normal weight. He is not ill-appearing, toxic-appearing or diaphoretic.  Cardiovascular:     Rate and Rhythm: Normal rate and regular rhythm.     Heart sounds: Normal heart sounds. No murmur heard.  No friction rub. No gallop.   Pulmonary:     Effort: Pulmonary effort is normal. No respiratory distress.     Breath sounds: Normal breath sounds. No stridor. No wheezing, rhonchi or rales.  Chest:     Chest wall: No tenderness.  Neurological:     General: No focal deficit present.     Mental Status: He is alert and oriented to person, place, and time. Mental status is at baseline.  Psychiatric:        Mood and Affect: Mood normal.        Behavior: Behavior normal.        Thought Content: Thought content normal.        Judgment: Judgment normal.     BP 116/64   Pulse 62   Temp 98.1 F (36.7 C) (Temporal)   Resp 17   Ht 6' (1.829 m)   Wt 172 lb 6.4 oz (78.2 kg)   SpO2 97%   BMI 23.38 kg/m  Wt Readings from Last 3 Encounters:  11/09/19 172 lb 6.4 oz (78.2 kg)  08/10/19 169 lb 3.2 oz (76.7 kg)  07/13/19 169 lb 3.2 oz (76.7 kg)     Health Maintenance Due  Topic Date Due  . INFLUENZA VACCINE  Never done    There are no preventive care reminders to display for this patient.  Lab Results  Component Value Date   TSH 1.040 07/13/2019   Lab Results  Component Value Date   WBC 6.0 07/13/2019   HGB 15.7 07/13/2019   HCT 46.0 07/13/2019   MCV 90 07/13/2019   PLT 261 07/13/2019   Lab Results  Component Value Date   NA 141 07/13/2019   K 4.5 07/13/2019   CO2 25 07/13/2019   GLUCOSE 77 07/13/2019   BUN 12 07/13/2019   CREATININE 1.05 07/13/2019   BILITOT 0.4 07/13/2019   ALKPHOS 91 07/13/2019   AST 18 07/13/2019   ALT 15 07/13/2019   PROT 6.0 07/13/2019   ALBUMIN 4.4 07/13/2019   CALCIUM 9.2 07/13/2019   ANIONGAP 9 05/24/2017   Lab Results  Component Value Date   CHOL 159 07/13/2019   Lab Results  Component Value Date   HDL 51 07/13/2019   Lab Results    Component Value Date   LDLCALC 88 07/13/2019   Lab Results  Component Value Date   TRIG 110 07/13/2019   Lab Results  Component Value Date   CHOLHDL 3.1  07/13/2019   No results found for: HGBA1C    Assessment & Plan:   Problem List Items Addressed This Visit      Other   GAD (generalized anxiety disorder)   Relevant Medications   escitalopram (LEXAPRO) 20 MG tablet      Meds ordered this encounter  Medications  . escitalopram (LEXAPRO) 20 MG tablet    Sig: Take 1 tablet (20 mg total) by mouth daily.    Dispense:  90 tablet    Refill:  3    Order Specific Question:   Supervising Provider    AnswerMyles Lipps [[id number]]    Follow-up: No follow-ups on file.    Janeece Agee, NP

## 2020-11-14 ENCOUNTER — Encounter: Payer: Self-pay | Admitting: Registered Nurse

## 2021-08-15 ENCOUNTER — Encounter: Payer: Self-pay | Admitting: Registered Nurse

## 2021-08-15 ENCOUNTER — Ambulatory Visit (INDEPENDENT_AMBULATORY_CARE_PROVIDER_SITE_OTHER): Payer: 59 | Admitting: Registered Nurse

## 2021-08-15 VITALS — BP 121/65 | HR 60 | Temp 98.2°F | Resp 18 | Ht 72.0 in | Wt 167.0 lb

## 2021-08-15 DIAGNOSIS — L509 Urticaria, unspecified: Secondary | ICD-10-CM

## 2021-08-15 MED ORDER — TRIAMCINOLONE ACETONIDE 0.1 % EX CREA: 1.0000 "application " | TOPICAL_CREAM | Freq: Two times a day (BID) | CUTANEOUS | 0 refills | Status: AC

## 2021-08-15 MED ORDER — CETIRIZINE HCL 10 MG PO TABS: 10.0000 mg | ORAL_TABLET | Freq: Every day | ORAL | 11 refills | Status: DC

## 2021-08-15 MED ORDER — PREDNISONE 10 MG PO TABS
ORAL_TABLET | ORAL | 0 refills | Status: DC
Start: 2021-08-15 — End: 2021-08-20

## 2021-08-15 MED ORDER — HYDROXYZINE HCL 10 MG PO TABS: 10.0000 mg | ORAL_TABLET | Freq: Three times a day (TID) | ORAL | 0 refills | Status: DC | PRN

## 2021-08-15 MED ORDER — MONTELUKAST SODIUM 10 MG PO TABS: 10.0000 mg | ORAL_TABLET | Freq: Every day | ORAL | 3 refills | Status: DC

## 2021-08-15 NOTE — Progress Notes (Signed)
? ?Established Patient Office Visit ? ?Subjective:  ?Patient ID: Tanner Flores, male    DOB: March 31, 1996  Age: 26 y.o. MRN: [id number] ? ?CC:  ?Chief Complaint  ?Patient presents with  ? Rash  ?  Patient states he has been having some rashes and hives all over his body. Patient states the rashes itch and burn and its hard to sleep due to rash.  ? ? ?HPI ?Tanner Flores presents for rash ? ?Onset a number of weeks ago with intermittent itchy patches. ?Over past 1-2 weeks has developed into hives across whole body ?No clear stimuli. Blood pressure cuff today triggered hives around upper arm. Irritation from a shirt has done this in the past. ? ?No changes to hygiene products, diet, medications. ? 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  ? ?No other changes of note. Tried OTC topicals without relief  ? ?Past Medical History:  ?Diagnosis Date  ? Right knee pain 01/02/2017  ? ? ?Past Surgical History:  ?Procedure Laterality Date  ? LAPAROSCOPIC APPENDECTOMY N/A 07/18/2016  ? Procedure: APPENDECTOMY LAPAROSCOPIC;  Surgeon: Avel Peace, MD;  Location: [facility];  Service: General;  Laterality: N/A;  ? WISDOM TOOTH EXTRACTION  04/2016  ? x4  ? ? ?Family History  ?Problem Relation Age of Onset  ? Diabetes Maternal Grandmother   ? Lung cancer Maternal Grandmother   ? Heart disease Maternal Grandfather   ? Lung cancer Maternal Grandfather   ? Diabetes Paternal Grandmother   ? ? ?Social History  ? ?Socioeconomic History  ? Marital status: Single  ?  Spouse name: Not on file  ? Number of children: Not on file  ? Years of education: 73  ? Highest education level: Not on file  ?Occupational History  ? Not on file  ?Tobacco Use  ? Smoking status: Never  ? Smokeless tobacco: Never  ?Vaping Use  ? Vaping Use: Never used  ?Substance and Sexual Activity  ? Alcohol use: No  ? Drug use: Not Currently  ? Sexual activity: Not Currently  ?Other Topics Concern  ? Not on file  ?Social History Narrative  ? Lives with parents  ? Caffeine use:  3 sodas per day  ? ?Social Determinants of Health  ? ?Financial Resource Strain: Not on file  ?Food Insecurity: Not on file  ?Transportation Needs: Not on file  ?Physical Activity: Not on file  ?Stress: Not on file  ?Social Connections: Not on file  ?Intimate Partner Violence: Not on file  ? ? ?Outpatient Medications Prior to Visit  ?Medication Sig Dispense Refill  ? escitalopram (LEXAPRO) 20 MG tablet Take 1 tablet (20 mg total) by mouth daily. 90 tablet 3  ? traZODone (DESYREL) 50 MG tablet Take 0.5-1 tablets (25-50 mg total) by mouth at bedtime as needed for sleep. 30 tablet 3  ? ondansetron (ZOFRAN ODT) 4 MG disintegrating tablet Take 1 tablet (4 mg total) by mouth every 8 (eight) hours as needed for nausea or vomiting. (Patient not taking: Reported on 07/13/2019) 10 tablet 0  ? ?No facility-administered medications prior to visit.  ? ? ?No Known Allergies ? ?ROS ?Review of Systems  ?Constitutional: Negative.   ?HENT: Negative.    ?Eyes: Negative.   ?Respiratory: Negative.    ?Cardiovascular: Negative.   ?Gastrointestinal: Negative.   ?Genitourinary: Negative.   ?Musculoskeletal: Negative.   ?Skin:  Positive for rash.  ?Neurological: Negative.   ?Psychiatric/Behavioral: Negative.    ?All other systems reviewed and are negative. ? ?  ?Objective:  ?  ?  Physical Exam ?Constitutional:   ?   General: He is not in acute distress. ?   Appearance: Normal appearance. He is normal weight. He is not ill-appearing, toxic-appearing or diaphoretic.  ?Cardiovascular:  ?   Rate and Rhythm: Normal rate and regular rhythm.  ?   Heart sounds: Normal heart sounds. No murmur heard. ?  No friction rub. No gallop.  ?Pulmonary:  ?   Effort: Pulmonary effort is normal. No respiratory distress.  ?   Breath sounds: Normal breath sounds. No stridor. No wheezing, rhonchi or rales.  ?Chest:  ?   Chest wall: No tenderness.  ?Skin: ?   General: Skin is warm and dry.  ?   Coloration: Skin is not jaundiced or pale.  ?   Findings: Erythema and rash  (widespread urticaria.) present. No bruising or lesion.  ?Neurological:  ?   General: No focal deficit present.  ?   Mental Status: He is alert and oriented to person, place, and time. Mental status is at baseline.  ?Psychiatric:     ?   Mood and Affect: Mood normal.     ?   Behavior: Behavior normal.     ?   Thought Content: Thought content normal.     ?   Judgment: Judgment normal.  ? ? ?BP 121/65   Pulse 60   Temp 98.2 ?F (36.8 ?C) (Temporal)   Resp 18   Ht 6' (1.829 m)   Wt 167 lb (75.8 kg)   SpO2 100%   BMI 22.65 kg/m?  ?Wt Readings from Last 3 Encounters:  ?08/15/21 167 lb (75.8 kg)  ?11/09/19 172 lb 6.4 oz (78.2 kg)  ?08/10/19 169 lb 3.2 oz (76.7 kg)  ? ? ? ?Health Maintenance Due  ?Topic Date Due  ? HPV VACCINES (1 - Male 2-dose series) Never done  ? Hepatitis C Screening  Never done  ? TETANUS/TDAP  Never done  ? INFLUENZA VACCINE  Never done  ? ? ?   ?Topic Date Due  ? HPV VACCINES (1 - Male 2-dose series) Never done  ? ? ?Lab Results  ?Component Value Date  ? TSH 1.040 07/13/2019  ? ?Lab Results  ?Component Value Date  ? WBC 6.0 07/13/2019  ? HGB 15.7 07/13/2019  ? HCT 46.0 07/13/2019  ? MCV 90 07/13/2019  ? PLT 261 07/13/2019  ? ?Lab Results  ?Component Value Date  ? NA 141 07/13/2019  ? K 4.5 07/13/2019  ? CO2 25 07/13/2019  ? GLUCOSE 77 07/13/2019  ? BUN 12 07/13/2019  ? CREATININE 1.05 07/13/2019  ? BILITOT 0.4 07/13/2019  ? ALKPHOS 91 07/13/2019  ? AST 18 07/13/2019  ? ALT 15 07/13/2019  ? PROT 6.0 07/13/2019  ? ALBUMIN 4.4 07/13/2019  ? CALCIUM 9.2 07/13/2019  ? ANIONGAP 9 05/24/2017  ? ?Lab Results  ?Component Value Date  ? CHOL 159 07/13/2019  ? ?Lab Results  ?Component Value Date  ? HDL 51 07/13/2019  ? ?Lab Results  ?Component Value Date  ? LDLCALC 88 07/13/2019  ? ?Lab Results  ?Component Value Date  ? TRIG 110 07/13/2019  ? ?Lab Results  ?Component Value Date  ? CHOLHDL 3.1 07/13/2019  ? ?No results found for: HGBA1C ? ?  ?Assessment & Plan:  ? ?Problem List Items Addressed This Visit    ?None ?Visit Diagnoses   ? ? Urticaria    -  Primary  ? Relevant Medications  ? montelukast (SINGULAIR) 10 MG tablet  ? predniSONE (DELTASONE) 10  MG tablet  ? cetirizine (ZYRTEC) 10 MG tablet  ? triamcinolone cream (KENALOG) 0.1 %  ? hydrOXYzine (ATARAX) 10 MG tablet  ? Other Relevant Orders  ? CBC with Differential/Platelet  ? Hepatitis panel, acute  ? Comprehensive metabolic panel  ? TSH  ? Ambulatory referral to Allergy  ? ?  ? ? ?Meds ordered this encounter  ?Medications  ? montelukast (SINGULAIR) 10 MG tablet  ?  Sig: Take 1 tablet (10 mg total) by mouth at bedtime.  ?  Dispense:  30 tablet  ?  Refill:  3  ?  Order Specific Question:   Supervising Provider  ?  Answer:   Neva SeatGREENE, JEFFREY R [[id number]]  ? predniSONE (DELTASONE) 10 MG tablet  ?  Sig: Take 4 tablets (40 mg total) by mouth daily with breakfast for 3 days, THEN 3 tablets (30 mg total) daily with breakfast for 3 days, THEN 2 tablets (20 mg total) daily with breakfast for 3 days, THEN 1 tablet (10 mg total) daily with breakfast for 3 days.  ?  Dispense:  30 tablet  ?  Refill:  0  ?  Order Specific Question:   Supervising Provider  ?  Answer:   Neva SeatGREENE, JEFFREY R [[id number]]  ? cetirizine (ZYRTEC) 10 MG tablet  ?  Sig: Take 1 tablet (10 mg total) by mouth daily.  ?  Dispense:  30 tablet  ?  Refill:  11  ?  Order Specific Question:   Supervising Provider  ?  Answer:   Neva SeatGREENE, JEFFREY R [[id number]]  ? triamcinolone cream (KENALOG) 0.1 %  ?  Sig: Apply 1 application. topically 2 (two) times daily.  ?  Dispense:  30 g  ?  Refill:  0  ?  Order Specific Question:   Supervising Provider  ?  Answer:   Neva SeatGREENE, JEFFREY R [[id number]]  ? hydrOXYzine (ATARAX) 10 MG tablet  ?  Sig: Take 1 tablet (10 mg total) by mouth 3 (three) times daily as needed.  ?  Dispense:  30 tablet  ?  Refill:  0  ?  Order Specific Question:   Supervising Provider  ?  Answer:   Neva SeatGREENE, JEFFREY R [[id number]]  ? ? ?Follow-up: Return if symptoms worsen or fail to improve.  ? ?PLAN ?Prednisone taper ?Can use cetirizine,  montelukast, and triamcinolone as instructed as needed ?Refer to allergy. ?Labs collected. Will follow up with the patient as warranted. ?Patient encouraged to call clinic with any questions, comments, or

## 2021-08-15 NOTE — Patient Instructions (Addendum)
Mr. Police -  ? ?Great to see you, sorry you're going through this. ? ?Take prednisone until complete unless it is not tolerable ? ?Can use hydroxyzine three times daily as needed, triamcinolone twice daily as needed, cetirizine daily in am and montelukast daily before bed. Don't have to use any of these if they're not working. ? ?I'll let you know how labs look ? ?Thanks, ? ?Rich  ?

## 2021-08-16 LAB — CBC WITH DIFFERENTIAL/PLATELET
Basophils Absolute: 0 10*3/uL (ref 0.0–0.1)
Basophils Relative: 0.7 % (ref 0.0–3.0)
Eosinophils Absolute: 0.1 10*3/uL (ref 0.0–0.7)
Eosinophils Relative: 1.9 % (ref 0.0–5.0)
HCT: 43.6 % (ref 39.0–52.0)
Hemoglobin: 14.8 g/dL (ref 13.0–17.0)
Lymphocytes Relative: 26.4 % (ref 12.0–46.0)
Lymphs Abs: 1.6 10*3/uL (ref 0.7–4.0)
MCHC: 33.9 g/dL (ref 30.0–36.0)
MCV: 91.6 fl (ref 78.0–100.0)
Monocytes Absolute: 0.6 10*3/uL (ref 0.1–1.0)
Monocytes Relative: 10.1 % (ref 3.0–12.0)
Neutro Abs: 3.6 10*3/uL (ref 1.4–7.7)
Neutrophils Relative %: 60.9 % (ref 43.0–77.0)
Platelets: 214 10*3/uL (ref 150.0–400.0)
RBC: 4.76 Mil/uL (ref 4.22–5.81)
RDW: 13.6 % (ref 11.5–15.5)
WBC: 5.9 10*3/uL (ref 4.0–10.5)

## 2021-08-16 LAB — HEPATITIS PANEL, ACUTE
Hep A IgM: NONREACTIVE
Hep B C IgM: NONREACTIVE
Hepatitis B Surface Ag: NONREACTIVE
Hepatitis C Ab: NONREACTIVE
SIGNAL TO CUT-OFF: <0.02 (ref <1.00)

## 2021-08-16 LAB — TSH: TSH: 1.43 u[IU]/mL (ref 0.35–5.50)

## 2021-08-16 LAB — COMPREHENSIVE METABOLIC PANEL
ALT: 16 U/L (ref 0–53)
AST: 19 U/L (ref 0–37)
Albumin: 5 g/dL (ref 3.5–5.2)
Alkaline Phosphatase: 80 U/L (ref 39–117)
BUN: 20 mg/dL (ref 6–23)
CO2: 31 mEq/L (ref 19–32)
Calcium: 9.7 mg/dL (ref 8.4–10.5)
Chloride: 98 mEq/L (ref 96–112)
Creatinine, Ser: 1.09 mg/dL (ref 0.40–1.50)
GFR: 94.74 mL/min (ref >60.00)
Glucose, Bld: 91 mg/dL (ref 70–99)
Potassium: 4.1 mEq/L (ref 3.5–5.1)
Sodium: 138 mEq/L (ref 135–145)
Total Bilirubin: 0.7 mg/dL (ref 0.2–1.2)
Total Protein: 7.6 g/dL (ref 6.0–8.3)

## 2021-08-19 NOTE — Progress Notes (Signed)
? ?New Patient Note ? ?RE: Tanner ChangJoshua Flores MRN: [id number] DOB: [birth year] ?Date of Office Visit: 08/20/2021 ? ?Consult requested by: Janeece AgeeMorrow, Richard, NP ?Primary care provider: Janeece AgeeMorrow, Richard, NP ? ?Chief Complaint: Establish Care and Urticaria (Started over 1 year ago--itchy, patches--5 months ago started to have hives--itchy is really bad since then) ? ?History of Present Illness: ?I had the pleasure of seeing Tanner ChangJoshua Flores for initial evaluation at the [facility] on 08/20/2021. He is a 26 y.o. male, who is referred here by Janeece AgeeMorrow, Richard, NP for the evaluation of hives. ? ?Rash started about 1+ year ago. This started on his finger, stomach, shin, elbows and thighs - describes it as red, dry and itchy. The stomach area is still there today, the other areas have faded.  ? ?About 5 months ago noted that now he is also breaking out in hives. This can occur anywhere on his body. Describes them as itchy, red, raised, painful. Individual rashes lasts about a few hours. No ecchymosis upon resolution. Associated symptoms include: no.  ?Frequency of episodes: daily. ?Suspected triggers are unknown. Denies any fevers, chills, changes in medications, foods, personal care products or recent infections. He has tried the following therapies: triamcinolone, hydroxyzine, Singulair, prednisone with good benefit. Systemic steroids yes. Currently on no daily meds.  ?Previous work up includes: 08/15/2021 TSH, CMP, hepatitis panel, CBC diff unremarkable. ?Previous history of rash/hives: denies. ?Patient is up to date with the following cancer screening tests: not up to date with physical exam. ? ?Reviewed images on the phone - consistent with urticarial rash. ?Not sure of any recent tick bites. 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 ?Patient states he lost some weight but in 2021 his weight was similar to today's weight per EMR.  ? ?Assessment and Plan: ?Tanner Flores  is a 26 y.o. male with: ?Rash and other nonspecific skin eruption ?Pruritic rash started 1 year ago - umbilical area, legs. Then started to break out in hives 5 months ago. No triggers noted. Recently tried some triamcinolone, hydroxyzine, Singulair and prednisone with some benefit. Denies changes in diet, meds, personal care products. 08/15/2021 TSH, CMP, hepatitis panel, CBC diff unremarkable.  ?Discussed with patient that he has 2 different rashes. 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  ?No skin testing today due to dermatographism.  ?Start zyrtec (cetirizine) 10mg  twice a day. ?If symptoms are not controlled or causes drowsiness let us know. ?Start pepcid (famotidine) 20mg  twice a day.  ?Avoid the following potential triggers: alcohol, tight clothing, NSAIDs, hot showers and getting overheated. ?See below for proper skincare. ?Make sure to shower and change clothes after work. ?Take pictures of flares.  ?Get bloodwork to rule out other etiologies.  ?Rash under the belly button:  ?Use mometasone 0.1% cream once a day as needed for rash flares. Do not use on the face, neck, armpits or groin area. Do not use more than 1 week in a row. ? ?Dermatographic urticaria ?See assessment and plan as above. ? ?Return in about 4 weeks (around 09/17/2021). ? ?Meds ordered this encounter  ?Medications  ? cetirizine (ZYRTEC ALLERGY) 10 MG tablet  ?  Sig: Take 1 tablet (10 mg total) by mouth 2 (two) times daily.  ?  Dispense:  60 tablet  ?  Refill:  3  ? famotidine (PEPCID) 20 MG tablet  ?  Sig: Take 1 tablet (20 mg total) by mouth 2 (two)  times daily.  ?  Dispense:  60 tablet  ?  Refill:  3  ? mometasone (ELOCON) 0.1 % ointment  ?  Sig: Apply topically daily as needed (rash). For thick, stubborn areas. Do not use on the face, neck, armpits or groin area. Do not use more than 2 weeks in a row.  ?  Dispense:  45 g  ?  Refill:  1  ? ?Lab Orders    ?     Allergens w/Total IgE  Area 2    ?     Alpha-Gal Panel    ?     ANA w/Reflex    ?     C3 and C4    ?     Chronic Urticaria    ?     C-reactive protein    ?     Sedimentation rate    ?     Tryptase    ? ? ?Other allergy screening: ?Asthma: no ?Rhino conjunctivitis: no ?Food allergy: no ?Medication allergy: no ?Hymenoptera allergy: no ?History of recurrent infections suggestive of immunodeficency: no ? ?Diagnostics: ?None.  ? ?Past Medical History: ?Patient Active Problem List  ? Diagnosis Date Noted  ? Rash and other nonspecific skin eruption 08/20/2021  ? Urticaria 08/20/2021  ? 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 Psychophysiological insomnia 08/10/2019  ? GAD (generalized anxiety disorder) 08/10/2019  ? Major depressive disorder with current active episode 12/15/2017  ? Right knee pain 01/02/2017  ? Acute mesenteric adenitis 07/19/2016  ? Right lower quadrant abdominal pain 07/17/2016  ? ?Past Medical History:  ?Diagnosis Date  ? Right knee pain 01/02/2017  ? ?Past Surgical History: ?Past Surgical History:  ?Procedure Laterality Date  ? LAPAROSCOPIC APPENDECTOMY N/A 07/18/2016  ? Procedure: APPENDECTOMY LAPAROSCOPIC;  Surgeon: Avel Peace, MD;  Location: [facility];  Service: General;  Laterality: N/A;  ? WISDOM TOOTH EXTRACTION  04/2016  ? x4  ? ?Medication List:  ?Current Outpatient Medications  ?Medication Sig Dispense Refill  ? cetirizine (ZYRTEC ALLERGY) 10 MG tablet Take 1 tablet (10 mg total) by mouth 2 (two) times daily. 60 tablet 3  ? famotidine (PEPCID) 20 MG tablet Take 1 tablet (20 mg total) by mouth 2 (two) times daily. 60 tablet 3  ? mometasone (ELOCON) 0.1 % ointment Apply topically daily as needed (rash). For thick, stubborn areas. Do not use on the face, neck, armpits or groin area. Do not use more than 2 weeks in a row. 45 g 1  ? escitalopram (LEXAPRO) 20 MG tablet Take 1 tablet (20 mg total) by mouth daily. (Patient not taking: Reported on 08/20/2021) 90 tablet 3  ? traZODone (DESYREL) 50 MG tablet Take 0.5-1 tablets  (25-50 mg total) by mouth at bedtime as needed for sleep. (Patient not taking: Reported on 08/20/2021) 30 tablet 3  ? triamcinolone cream (KENALOG) 0.1 % Apply 1 application. topically 2 (two) times daily. (Patient not taking: Reported on 08/20/2021) 30 g 0  ? ?No current facility-administered medications for this visit.  ? ?Allergies: ?No Known Allergies ?Social History: ?Social History  ? ?Socioeconomic History  ? Marital status: Single  ?  Spouse name: Not on file  ? Number of children: Not on file  ? Years of education: 7  ? Highest education level: Not on file  ?Occupational History  ? Not on file  ?Tobacco Use  ? Smoking status: Never  ? Smokeless tobacco: Never  ?Vaping Use  ? Vaping Use: Never used  ?Substance and Sexual Activity  ?  Alcohol use: No  ? Drug use: Not Currently  ? Sexual activity: Not Currently  ?Other Topics Concern  ? Not on file  ?Social History Narrative  ? Lives with parents  ? Caffeine use: 3 sodas per day  ? ?Social Determinants of Health  ? ?Financial Resource Strain: Not on file  ?Food Insecurity: Not on file  ?Transportation Needs: Not on file  ?Physical Activity: Not on file  ?Stress: Not on file  ?Social Connections: Not on file  ? ?Lives in an apartment ?Smoking: vapes ?Occupation: landscaping x 3 yrs ? ?Environmental History: ?Water Damage/mildew in the house: no ?Carpet in the family room: yes ?Carpet in the bedroom: yes ?Heating: electric ?Cooling: central ?Pet: yes 1 dog x 3 yrs ? ?Family History: ?Family History  ?Problem Relation Age of Onset  ? Diabetes Maternal Grandmother   ? Lung cancer Maternal Grandmother   ? Heart disease Maternal Grandfather   ? Lung cancer Maternal Grandfather   ? Diabetes Paternal Grandmother   ? ?Problem                               Relation ?Asthma                                   no ?Eczema                                no ?Food allergy                          no ?Allergic rhino conjunctivitis     no ? ?Review of Systems  ?Constitutional:   Positive for unexpected weight change (10+ weight loss over a few months). Negative for appetite change, chills and fever.  ?HENT:  Negative for congestion and rhinorrhea.   ?Eyes:  Negative for itching.  ?Re

## 2021-08-20 ENCOUNTER — Ambulatory Visit: Payer: 59 | Admitting: Allergy

## 2021-08-20 ENCOUNTER — Other Ambulatory Visit: Payer: Self-pay

## 2021-08-20 ENCOUNTER — Encounter: Payer: Self-pay | Admitting: Allergy

## 2021-08-20 VITALS — BP 120/74 | HR 108 | Temp 98.3°F | Resp 16 | Ht 72.0 in | Wt 171.1 lb

## 2021-08-20 DIAGNOSIS — R21 Rash and other nonspecific skin eruption: Secondary | ICD-10-CM | POA: Diagnosis not present

## 2021-08-20 DIAGNOSIS — L509 Urticaria, unspecified: Secondary | ICD-10-CM

## 2021-08-20 DIAGNOSIS — L503 Dermatographic urticaria: Secondary | ICD-10-CM

## 2021-08-20 MED ORDER — FAMOTIDINE 20 MG PO TABS: 20.0000 mg | ORAL_TABLET | Freq: Two times a day (BID) | ORAL | 3 refills | Status: AC

## 2021-08-20 MED ORDER — CETIRIZINE HCL 10 MG PO TABS: 10.0000 mg | ORAL_TABLET | Freq: Two times a day (BID) | ORAL | 3 refills | Status: AC

## 2021-08-20 MED ORDER — MOMETASONE FUROATE 0.1 % EX OINT: TOPICAL_OINTMENT | Freq: Every day | CUTANEOUS | 1 refills | Status: DC | PRN

## 2021-08-20 NOTE — Assessment & Plan Note (Addendum)
Pruritic rash started 1 year ago - umbilical area, legs. Then started to break out in hives 5 months ago. No triggers noted. Recently tried some triamcinolone, hydroxyzine, Singulair and prednisone with some benefit. Denies changes in diet, meds, personal care products. 08/15/2021 TSH, CMP, hepatitis panel, CBC diff unremarkable.  ?? Discussed with patient that he has 2 different rashes. 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  ?? 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  ?? Start zyrtec (cetirizine) 10mg  twice a day. ?? If symptoms are not controlled or causes drowsiness let know. ?? Start pepcid (famotidine) 20mg  twice a day.  ?? Avoid the following potential triggers: alcohol, tight clothing, NSAIDs, hot showers and getting overheated. ?? See below for proper skincare. ?o Make sure to shower and change clothes after work. ?? Take pictures of flares.  ?? Get bloodwork to rule out other etiologies.  ?? Rash under the belly button:  ?o Use mometasone 0.1% cream once a day as needed for rash flares. Do not use on the face, neck, armpits or groin area. Do not use more than 1 week in a row. ?

## 2021-08-20 NOTE — Assessment & Plan Note (Signed)
.   See assessment and plan as above. 

## 2021-08-20 NOTE — Patient Instructions (Addendum)
You have at least 2 different rashes going on.  ? ?Hives/dermatographism.  ?Start zyrtec (cetirizine) 10mg  twice a day. ?If symptoms are not controlled or causes drowsiness let know. ?Start pepcid (famotidine) 20mg  twice a day.  ?Avoid the following potential triggers: alcohol, tight clothing, NSAIDs, hot showers and getting overheated. ?See below for proper skincare. ?Make sure to shower and change clothes after work. ?Take pictures of flares.  ?Get bloodwork:  ?We are ordering labs, so please allow 1-2 weeks for the results to come back. ?With the newly implemented Cures Act, the labs might be visible to you at the same time that they become visible to me. However, I will not address the results until all of the results are back, so please be patient.   ? ?Rash under the belly button:  ?Use mometasone 0.1% cream once a day as needed for rash flares. Do not use on the face, neck, armpits or groin area. Do not use more than 1 week in a row.  ? ?Weight loss ?Keep track of weight.  ? ?Follow up in 1 months or sooner if needed.  ? ?Skin care recommendations ? ?Bath time: ?Always use lukewarm water. AVOID very hot or cold water. ?Keep bathing time to 5-10 minutes. ?Do NOT use bubble bath. ?Use a mild soap and use just enough to wash the dirty areas. ?Do NOT scrub skin vigorously.  ?After bathing, pat dry your skin with a towel. Do NOT rub or scrub the skin. ? ?Moisturizers and prescriptions:  ?ALWAYS apply moisturizers immediately after bathing (within 3 minutes). This helps to lock-in moisture. ?Use the moisturizer several times a day over the whole body. ?Good summer moisturizers include: Aveeno, CeraVe, Cetaphil. ?Good winter moisturizers include: Aquaphor, Vaseline, Cerave, Cetaphil, Eucerin, Vanicream. ?When using moisturizers along with medications, the moisturizer should be applied about one hour after applying the medication to prevent diluting effect of the medication or moisturize around where you applied  the medications. When not using medications, the moisturizer can be continued twice daily as maintenance. ? ?Laundry and clothing: ?Avoid laundry products with added color or perfumes. ?Use unscented hypo-allergenic laundry products such as Tide free, Cheer free & gentle, and All free and clear.  ?If the skin still seems dry or sensitive, you can try double-rinsing the clothes. ?Avoid tight or scratchy clothing such as wool. ?Do not use fabric softeners or dyer sheets.  ?

## 2021-08-25 LAB — ALLERGENS W/TOTAL IGE AREA 2
Alternaria Alternata IgE: <0.1 kU/L
Aspergillus Fumigatus IgE: 0.1 kU/L — AB
Bermuda Grass IgE: 2.42 kU/L — AB
Cat Dander IgE: <0.1 kU/L
Cedar, Mountain IgE: 1.74 kU/L — AB
Cladosporium Herbarum IgE: <0.1 kU/L
Cockroach, German IgE: 1.47 kU/L — AB
Common Silver Birch IgE: 2.01 kU/L — AB
Cottonwood IgE: 1.84 kU/L — AB
D Farinae IgE: 0.77 kU/L — AB
D Pteronyssinus IgE: 0.16 kU/L — AB
Dog Dander IgE: 0.17 kU/L — AB
Elm, American IgE: 2.26 kU/L — AB
Johnson Grass IgE: 2.71 kU/L — AB
Maple/Box Elder IgE: 2.17 kU/L — AB
Mouse Urine IgE: <0.1 kU/L
Oak, White IgE: 2.17 kU/L — AB
Pecan, Hickory IgE: 1.91 kU/L — AB
Penicillium Chrysogen IgE: <0.1 kU/L
Pigweed, Rough IgE: 1.79 kU/L — AB
Ragweed, Short IgE: 2.26 kU/L — AB
Sheep Sorrel IgE Qn: 2.27 kU/L — AB
Timothy Grass IgE: 2.23 kU/L — AB
White Mulberry IgE: 1.66 kU/L — AB

## 2021-08-25 LAB — CHRONIC URTICARIA: cu index: <1.8 (ref <10)

## 2021-08-25 LAB — ALPHA-GAL PANEL
Allergen Lamb IgE: 0.1 kU/L — AB
Beef IgE: 0.11 kU/L — AB
IgE (Immunoglobulin E), Serum: 495 IU/mL (ref 6–495)
O215-IgE Alpha-Gal: 0.2 kU/L — AB
Pork IgE: 0.12 kU/L — AB

## 2021-08-25 LAB — C3 AND C4
Complement C3, Serum: 101 mg/dL (ref 82–167)
Complement C4, Serum: 14 mg/dL (ref 12–38)

## 2021-08-25 LAB — TRYPTASE: Tryptase: 6.6 ug/L (ref 2.2–13.2)

## 2021-08-25 LAB — SEDIMENTATION RATE: Sed Rate: 2 mm/hr (ref 0–15)

## 2021-08-25 LAB — C-REACTIVE PROTEIN: CRP: <1 mg/L (ref 0–10)

## 2021-08-25 LAB — ANA W/REFLEX: Anti Nuclear Antibody (ANA): NEGATIVE

## 2021-09-16 NOTE — Patient Instructions (Addendum)
Hives/dermatographism.  (Has 2 different rashes) ?Stop taking Benadryl in the morning and see if you remain symptom-free ?Continue hydroxyzine at night as needed for itching ?Start avoiding all red mammalian meat to see if this helps with the hives/itching ?Consider Xolair injections in the future  ?Continue Zyrtec (cetirizine) 10mg  twice a day. ?If symptoms are not controlled or causes drowsiness let know. ?Continue  Pepcid (famotidine) 20mg  twice a day.  ?Avoid the following potential triggers: alcohol, tight clothing, NSAIDs, hot showers and getting overheated. ?See below for proper skincare. ?Make sure to shower and change clothes after work. ?Take pictures of flares.  ?Continue to avoid all red meat due to being borderline positive ?Rash under the belly button:  ?Use mometasone 0.1% cream once a day as needed for rash flares. Do not use on the face, neck, armpits or groin area. Do not use more than 1 week in a row.  ?We will refer you to dermatology for possible biopsy due to continued rash. ? ?Weight loss (stable) ?Keep track of weight.  ? ?Seasonal and perennial allergic rhinitis (lab work positive to dust mite, dog, grass, cockroach, 1 mold, tree, ragweed, and weed pollen ?-Continue Zyrtec (cetirizine) as above ? ?Allergic conjunctivitis ?-Start Pataday (olopatadine) 1 drop each eye once a day as needed for itchy watery eyes. ?-If you continue to have the sensation of something being stuck in your eyes recommend going to urgent care schedule an appointment with your primary care physician ? ?Schedule an appointment with your primary care physician to discuss the cramping pains that you have been having occasionally in your chest.  Also speak with your primary care physician about the increased frequency of urination. ?Follow up in 2 months or sooner if needed.  ? ?Skin care recommendations ? ?Bath time: ?Always use lukewarm water. AVOID very hot or cold water. ?Keep bathing time to 5-10 minutes. ?Do NOT  use bubble bath. ?Use a mild soap and use just enough to wash the dirty areas. ?Do NOT scrub skin vigorously.  ?After bathing, pat dry your skin with a towel. Do NOT rub or scrub the skin. ? ?Moisturizers and prescriptions:  ?ALWAYS apply moisturizers immediately after bathing (within 3 minutes). This helps to lock-in moisture. ?Use the moisturizer several times a day over the whole body. ?Good summer moisturizers include: Aveeno, CeraVe, Cetaphil. ?Good winter moisturizers include: Aquaphor, Vaseline, Cerave, Cetaphil, Eucerin, Vanicream. ?When using moisturizers along with medications, the moisturizer should be applied about one hour after applying the medication to prevent diluting effect of the medication or moisturize around where you applied the medications. When not using medications, the moisturizer can be continued twice daily as maintenance. ? ?Laundry and clothing: ?Avoid laundry products with added color or perfumes. ?Use unscented hypo-allergenic laundry products such as Tide free, Cheer free & gentle, and All free and clear.  ?If the skin still seems dry or sensitive, you can try double-rinsing the clothes. ?Avoid tight or scratchy clothing such as wool. ?Do not use fabric softeners or dyer sheets.  ?

## 2021-09-17 ENCOUNTER — Ambulatory Visit: Payer: 59 | Admitting: Family

## 2021-09-17 ENCOUNTER — Encounter: Payer: Self-pay | Admitting: Family

## 2021-09-17 VITALS — BP 102/58 | HR 53 | Temp 98.4°F | Resp 16 | Ht 71.0 in | Wt 170.4 lb

## 2021-09-17 DIAGNOSIS — H1013 Acute atopic conjunctivitis, bilateral: Secondary | ICD-10-CM

## 2021-09-17 DIAGNOSIS — R21 Rash and other nonspecific skin eruption: Secondary | ICD-10-CM | POA: Diagnosis not present

## 2021-09-17 DIAGNOSIS — L509 Urticaria, unspecified: Secondary | ICD-10-CM

## 2021-09-17 DIAGNOSIS — J3089 Other allergic rhinitis: Secondary | ICD-10-CM

## 2021-09-17 DIAGNOSIS — J302 Other seasonal allergic rhinitis: Secondary | ICD-10-CM

## 2021-09-17 MED ORDER — MOMETASONE FUROATE 0.1 % EX OINT: TOPICAL_OINTMENT | Freq: Every day | CUTANEOUS | 0 refills | Status: AC | PRN

## 2021-09-17 MED ORDER — OLOPATADINE HCL 0.2 % OP SOLN: 1.0000 [drp] | Freq: Every day | OPHTHALMIC | 5 refills | Status: AC | PRN

## 2021-09-17 NOTE — Progress Notes (Addendum)
? ?[address] ?[place] [address] ?Dept: [phone] ? ?FOLLOW UP NOTE ? ?Patient ID: Tanner Flores, male    DOB: 07/24/1995  Age: 26 y.o. MRN: [id number] ?Date of Office Visit: 09/17/2021 ? ?Assessment  ?Chief Complaint: Follow-up (No issues right now. The medication has been working. Only when he forgets to take the medication that he will break out. If he does break out it is around his eyes and they get swollen. Hands will feel tight and swollen like he is going to break out in hives but nothing is visible on the skin.) ? ?HPI ?Tanner Flores is a 26 year old male who presents today for follow-up of rash and other nonspecific skin eruption and demographic urticaria.  He was last seen on August 20, 2021 by Dr. Selena Batten.  Since his last office visit he denies any new diagnosis or surgeries. ? ?Urticaria is reported as doing better as long as he remembers to take his medication.  His lab work to rule out other etiologies was unremarkable other than his alpha gal, IgE beef, IgE pork, and IgE Lamb being low.  He reports that there is been a time  or two he forgot to take one of his medications and the next day he had itching.  He does mention that he is pretty much fine and will have slight breakouts with spots around his eyes and temple region.  He takes Benadryl in the morning, Zyrtec twice a day, famotidine 20 mg twice a day, and hydroxyzine at night.  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  Instructed him to try stopping the Benadryl in the morning.  He verbalizes understanding.  He has tried decreasing his red meat, but this has been hard due to the cost of groceries.  He reports that he does eat a lot of pork.  He has probably had red mammalian meat 4 times this past month.  He denies any concomitant cardiorespiratory and gastrointestinal symptoms when he has the hives.   ? ?He reports that he continues to have the rash under his abdomen.  It does not  ever completely go away.  The mometasone that he uses as needed does help with the itching.  He has not ever seen a dermatologist for this rash.  He reports that the rash used to be bigger when it was more irritated but over time it is gotten smaller.  He reports this rash has been here for couple of years.  He denies any new medications, foods, personal care products, or recent infections.  No one else in his household has the rash.  He also denies any recent bug bites. ? ?Seasonal and perennial allergic rhinitis is reported as controlled with Zyrtec twice a day.  He denies rhinorrhea, nasal congestion, and postnasal drip.  He has not had any sinus infections since we last saw him.  He reports itchy eyes.  He works outside and tries to wear sunglasses, but if he does not his itchy eyes are worse.  He reports at times his eyes can be swollen and he tries not to scratch sometimes.  He feels like something is in both eyes like  dirt.  He has been using over-the-counter saline drops.  He has not been able to see anything in his eyes.  He denies any drainage from his eyes, pain, or vision changes. ? ?He has not noticed any weight loss since his last office visit.  He does mention that approximately  5 years ago he lost 110 pounds and sometime after that he lost an additional 20 pounds that he has not been able to gain back.  Instructed him to speak with his primary care physician about this. ? ? ?Drug Allergies:  ?No Known Allergies ? ?Review of Systems: ?Review of Systems  ?Constitutional:  Negative for chills, fever and weight loss.  ?HENT:    ?     Denies rhinorrhea, nasal congestion, and postnasal drip  ?Eyes:  Negative for pain, discharge and redness.  ?     Reports itchy eyes that at times can be swollen.  He also reports like something is in both of his eyes.  ?Respiratory:  Negative for cough, shortness of breath and wheezing.   ?Cardiovascular:   ?     Reports cramping pain in the middle/right area of his chest  at times.  He reports this occurs sporadically  ?Gastrointestinal:   ?     Denies heartburn or reflux symptoms  ?Genitourinary:  Positive for frequency.  ?Skin:  Positive for itching and rash.  ?     Reports that he continues to have rash on his lower abdomen underneath his umbilicus.  At times can be itchy.  Reports that he can be itchy if he forgets to take his antihistamines  ?Neurological:  Negative for headaches.  ?Endo/Heme/Allergies:  Positive for environmental allergies.  ? ? ?Physical Exam: ?BP (!) 102/58   Pulse (!) 53   Temp 98.4 ?F (36.9 ?C) (Skin)   Resp 16   Ht 5\' 11"  (1.803 m)   Wt 170 lb 6.4 oz (77.3 kg)   SpO2 98%   BMI 23.77 kg/m?   ? ?Physical Exam ?Constitutional:   ?   Appearance: Normal appearance.  ?HENT:  ?   Head: Normocephalic and atraumatic.  ?   Comments: Pharynx normal, eyes normal, ears normal, nose: Bilateral lower turbinates moderately edematous and slightly erythematous with no drainage noted ?   Right Ear: Tympanic membrane, ear canal and external ear normal.  ?   Left Ear: Tympanic membrane, ear canal and external ear normal.  ?   Mouth/Throat:  ?   Mouth: Mucous membranes are moist.  ?   Pharynx: Oropharynx is clear.  ?Eyes:  ?   Conjunctiva/sclera: Conjunctivae normal.  ?Cardiovascular:  ?   Rate and Rhythm: Regular rhythm. Bradycardia present.  ?   Heart sounds: Normal heart sounds.  ?Pulmonary:  ?   Effort: Pulmonary effort is normal.  ?   Breath sounds: Normal breath sounds.  ?   Comments: Lungs clear to auscultation ?Musculoskeletal:  ?   Cervical back: Neck supple.  ?Skin: ?   General: Skin is warm.  ?   Comments:  erythematous papular rash noted in lower abdomen below umbilicus  ?Neurological:  ?   Mental Status: He is alert and oriented to person, place, and time.  ?Psychiatric:     ?   Mood and Affect: Mood normal.     ?   Behavior: Behavior normal.     ?   Thought Content: Thought content normal.     ?   Judgment: Judgment normal.   ? ? ?Diagnostics: ?None ? ?Assessment and Plan: ?1. Urticaria   ?2. Rash and other nonspecific skin eruption   ?3. Seasonal and perennial allergic rhinitis   ?4. Allergic conjunctivitis of both eyes   ? ? ?Meds ordered this encounter  ?Medications  ? mometasone (ELOCON) 0.1 % ointment  ?  Sig: Apply  topically daily as needed (rash). For thick, stubborn areas. Do not use on the face, neck, armpits or groin area. Do not use more than 2 weeks in a row.  ?  Dispense:  45 g  ?  Refill:  0  ? Olopatadine HCl 0.2 % SOLN  ?  Sig: Apply 1 drop to eye daily as needed (for itcy and watery eyes).  ?  Dispense:  2.5 mL  ?  Refill:  5  ? ? ?Patient Instructions  ?Hives/dermatographism.  (Has 2 different rashes) ?Stop taking Benadryl in the morning and see if you remain symptom-free ?Continue hydroxyzine at night as needed for itching ?Start avoiding all red mammalian meat to see if this helps with the hives/itching ?Consider Xolair injections in the future  ?Continue Zyrtec (cetirizine) 10mg  twice a day. ?If symptoms are not controlled or causes drowsiness let us know. ?Continue  Pepcid (famotidine) 20mg  twice a day.  ?Avoid the following potential triggers: alcohol, tight clothing, NSAIDs, hot showers and getting overheated. ?See below for proper skincare. ?Make sure to shower and change clothes after work. ?Take pictures of flares.  ?Continue to avoid all red meat due to being borderline positive ?Rash under the belly button:  ?Use mometasone 0.1% cream once a day as needed for rash flares. Do not use on the face, neck, armpits or groin area. Do not use more than 1 week in a row.  ?We will refer you to dermatology for possible biopsy due to continued rash. ? ?Weight loss (stable) ?Keep track of weight.  ? ?Seasonal and perennial allergic rhinitis (lab work positive to dust mite, dog, grass, cockroach, 1 mold, tree, ragweed, and weed pollen ?-Continue Zyrtec (cetirizine) as above ? ?Allergic conjunctivitis ?-Start Pataday  (olopatadine) 1 drop each eye once a day as needed for itchy watery eyes. ?-If you continue to have the sensation of something being stuck in your eyes recommend going to urgent care schedule an appointment with your primary care physician ? ?Sche

## 2021-09-24 ENCOUNTER — Telehealth: Payer: Self-pay | Admitting: Family

## 2021-09-24 NOTE — Telephone Encounter (Signed)
Referral has been placed to [facility] & scheduled appointment for patient. Unfortunately the dermatologist offices in [place] do not accept [facility]. Patient is scheduled for 11-12-2021 at 3:15pm with Dr. Selena Batten. ? ?[facility] ?[address] [place],  ?[address] ?[place] [address]  ? ?Phone: [phone] ?Fax: [phone] ? ?Called pt and left voicemail for patient to return call & discuss.  ? ?

## 2021-09-24 NOTE — Telephone Encounter (Signed)
-----   Message from Nehemiah Settle, FNP sent at 09/17/2021  3:26 PM EDT ----- ?Refer to dermatology due to rash on lower abdomen. Possible biopsy ?

## 2021-09-24 NOTE — Telephone Encounter (Signed)
Thank you :)

## 2021-09-27 NOTE — Telephone Encounter (Signed)
Called patient to advise him of referral & appointment, as I had not heard back from him.  ? ?Provided patient with date & time of appointment as well as the address and phone number to [facility]. Patient verbalized understanding.  ?

## 2021-10-03 ENCOUNTER — Other Ambulatory Visit: Payer: Self-pay | Admitting: Registered Nurse

## 2021-10-03 DIAGNOSIS — L509 Urticaria, unspecified: Secondary | ICD-10-CM

## 2021-10-17 ENCOUNTER — Encounter: Payer: Self-pay | Admitting: Family Medicine

## 2021-10-17 ENCOUNTER — Ambulatory Visit (INDEPENDENT_AMBULATORY_CARE_PROVIDER_SITE_OTHER): Payer: 59 | Admitting: Family Medicine

## 2021-10-17 VITALS — BP 110/82 | HR 90 | Temp 100.8°F | Resp 16 | Ht 71.0 in | Wt 165.2 lb

## 2021-10-17 DIAGNOSIS — R062 Wheezing: Secondary | ICD-10-CM

## 2021-10-17 DIAGNOSIS — R059 Cough, unspecified: Secondary | ICD-10-CM | POA: Diagnosis not present

## 2021-10-17 DIAGNOSIS — B9689 Other specified bacterial agents as the cause of diseases classified elsewhere: Secondary | ICD-10-CM | POA: Diagnosis not present

## 2021-10-17 DIAGNOSIS — J329 Chronic sinusitis, unspecified: Secondary | ICD-10-CM

## 2021-10-17 LAB — POC COVID19 BINAXNOW: SARS Coronavirus 2 Ag: NEGATIVE

## 2021-10-17 MED ORDER — PROMETHAZINE-DM 6.25-15 MG/5ML PO SYRP
5.0000 mL | ORAL_SOLUTION | Freq: Four times a day (QID) | ORAL | 0 refills | Status: AC | PRN
Start: 2021-10-17 — End: ?

## 2021-10-17 MED ORDER — AMOXICILLIN 875 MG PO TABS: 875.0000 mg | ORAL_TABLET | Freq: Two times a day (BID) | ORAL | 0 refills | Status: AC

## 2021-10-17 MED ORDER — ALBUTEROL SULFATE HFA 108 (90 BASE) MCG/ACT IN AERS: 2.0000 | INHALATION_SPRAY | Freq: Four times a day (QID) | RESPIRATORY_TRACT | 0 refills | Status: AC | PRN

## 2021-10-17 NOTE — Progress Notes (Signed)
? ?  Subjective:  ? ? Patient ID: Tanner Flores, male    DOB: 08-12-1995, 26 y.o.   MRN: [id number] ? ?HPI ?Cough/congestion- sxs started ~3 days ago w/ congestion but worsened yesterday.  Now w/ audible wheezing.  Temp of 100.8  Home COVID test was negative yesterday.  Cough is productive.  Last night chest was tight, 'nose was completely blocked up'.  No hx of asthma or SOB.  + HA- 'constant pressure'.  Denies body aches and chills. ? ? ?Review of Systems ?For ROS see HPI  ?   ?Objective:  ? Physical Exam ?Vitals reviewed.  ?Constitutional:   ?   General: He is not in acute distress. ?   Appearance: Normal appearance. He is well-developed. He is not ill-appearing.  ?HENT:  ?   Head: Normocephalic and atraumatic.  ?   Right Ear: Tympanic membrane normal.  ?   Left Ear: Tympanic membrane normal.  ?   Nose: Mucosal edema and rhinorrhea present.  ?   Right Sinus: Maxillary sinus tenderness and frontal sinus tenderness present.  ?   Left Sinus: Maxillary sinus tenderness and frontal sinus tenderness present.  ?   Mouth/Throat:  ?   Pharynx: Oropharyngeal exudate and posterior oropharyngeal erythema present.  ?Eyes:  ?   Conjunctiva/sclera: Conjunctivae normal.  ?   Pupils: Pupils are equal, round, and reactive to light.  ?Cardiovascular:  ?   Rate and Rhythm: Normal rate and regular rhythm.  ?   Heart sounds: Normal heart sounds.  ?Pulmonary:  ?   Effort: Pulmonary effort is normal. No respiratory distress.  ?   Breath sounds: Wheezing (end expiratory wheezes on L>R) present.  ?Musculoskeletal:  ?   Cervical back: Normal range of motion and neck supple.  ?Lymphadenopathy:  ?   Cervical: No cervical adenopathy.  ?Skin: ?   General: Skin is warm and dry.  ?Neurological:  ?   Mental Status: He is alert.  ? ? ? ? ? ?   ?Assessment & Plan:  ? ?Bacterial sinusitis- new.  Pt's facial pain/pressure, HA, congestion, fever consistent w/ infxn.  Start high dose Amox.  Reviewed supportive care and red flags that should prompt  return.  Pt expressed understanding and is in agreement w/ plan.  ? ?Cough w/ wheezing- new.  Pt doesn't have a hx of wheezing but is wheezing today on exam.  Will start Amoxicillin as above and add Promethazine cough syrup and Albuterol.  Reviewed supportive care and red flags that should prompt return.  Pt expressed understanding and is in agreement w/ plan.  ?

## 2021-10-17 NOTE — Patient Instructions (Addendum)
Follow up as needed or as scheduled ?Thankfully COVID negative! ?START the Amoxicillin twice daily- take w/ food ?Lots of fluids!  Lots of rest! ?Alternate tylenol/ibuprofen every 4 hrs for fever/headache ?USE the inhaler- 2 puffs every 4 hrs as needed for cough, chest tightness, wheezing ?Take the cough syrup as needed- this may cause drowsiness ?Call with any questions or concerns ?Hang in there!!! ?

## 2021-11-10 ENCOUNTER — Other Ambulatory Visit: Payer: Self-pay | Admitting: Registered Nurse

## 2021-11-10 DIAGNOSIS — L509 Urticaria, unspecified: Secondary | ICD-10-CM

## 2021-11-21 ENCOUNTER — Encounter: Payer: Self-pay | Admitting: Family Medicine

## 2021-11-21 ENCOUNTER — Ambulatory Visit (INDEPENDENT_AMBULATORY_CARE_PROVIDER_SITE_OTHER): Payer: 59 | Admitting: Family Medicine

## 2021-11-21 VITALS — BP 122/80 | HR 68 | Temp 99.7°F | Resp 16 | Ht 71.0 in | Wt 166.5 lb

## 2021-11-21 DIAGNOSIS — M791 Myalgia, unspecified site: Secondary | ICD-10-CM

## 2021-11-21 DIAGNOSIS — R82998 Other abnormal findings in urine: Secondary | ICD-10-CM | POA: Diagnosis not present

## 2021-11-21 DIAGNOSIS — M6282 Rhabdomyolysis: Secondary | ICD-10-CM

## 2021-11-21 DIAGNOSIS — R300 Dysuria: Secondary | ICD-10-CM

## 2021-11-21 LAB — POCT URINALYSIS DIPSTICK
Glucose, UA: NEGATIVE
Leukocytes, UA: NEGATIVE
Protein, UA: NEGATIVE
Spec Grav, UA: 1.025 (ref 1.010–1.025)
Urobilinogen, UA: 0.2 E.U./dL
pH, UA: 6 (ref 5.0–8.0)

## 2021-11-21 LAB — BASIC METABOLIC PANEL
BUN: 11 mg/dL (ref 6–23)
CO2: 33 mEq/L — ABNORMAL HIGH (ref 19–32)
Calcium: 9.6 mg/dL (ref 8.4–10.5)
Chloride: 100 mEq/L (ref 96–112)
Creatinine, Ser: 0.92 mg/dL (ref 0.40–1.50)
GFR: 115.9 mL/min (ref >60.00)
Glucose, Bld: 98 mg/dL (ref 70–99)
Potassium: 4.8 mEq/L (ref 3.5–5.1)
Sodium: 137 mEq/L (ref 135–145)

## 2021-11-21 NOTE — Patient Instructions (Signed)
Schedule a lab only visit for tomorrow or Friday to recheck muscle enzymes INCREASE your water.  WATER, WATER, WATER STOP any OTC supplements or protein powders DO NOT do any physical activity until your muscle aches are feeling much better- that means work, too DO NOT take any motrin, advil, aleve, etc for your muscle pain You CAN take Tylenol Call with any questions or concerns Hang in there!

## 2021-11-21 NOTE — Progress Notes (Signed)
   Subjective:    Patient ID: Tanner Flores, male    DOB: [birth year], 26 y.o.   MRN: [id number]  HPI Dark urine- pt reports urine has looked like 'watery pepsi'.  Friday 6/9 did 'an extreme workout'.  Worked out for multiple hours.  2 days after workout developed severe aching in legs bilaterally.  Tuesday afternoon 6/13 urine became dark.  Increased water intake and urine would lighten up somewhat.  Typically drinks 1/2 gallon of water a day.     Review of Systems For ROS see HPI     Objective:   Physical Exam Vitals reviewed.  Constitutional:      General: He is not in acute distress.    Appearance: Normal appearance. He is not ill-appearing.  HENT:     Head: Normocephalic and atraumatic.  Eyes:     General: No scleral icterus.    Extraocular Movements: Extraocular movements intact.     Conjunctiva/sclera: Conjunctivae normal.  Cardiovascular:     Rate and Rhythm: Normal rate and regular rhythm.  Pulmonary:     Effort: Pulmonary effort is normal. No respiratory distress.     Breath sounds: Normal breath sounds. No wheezing.  Abdominal:     General: There is no distension.     Palpations: Abdomen is soft.     Tenderness: There is no abdominal tenderness. There is no guarding.  Musculoskeletal:     Right lower leg: No edema.     Left lower leg: No edema.  Skin:    General: Skin is warm and dry.     Coloration: Skin is not jaundiced.     Findings: No bruising or rash.  Neurological:     General: No focal deficit present.     Mental Status: He is alert and oriented to person, place, and time.     Gait: Gait abnormal (antalgic gait).  Psychiatric:        Mood and Affect: Mood normal.        Behavior: Behavior normal.        Thought Content: Thought content normal.           Assessment & Plan:   Rhabdo- new.  Pt w/ muscle pain and dark urine with known inciting event (extreme work out).  This equates rhabdo until proven otherwise.  Unfortunately, his workout was  on 6/9 and his urine turned dark on 6/13 (8 days ago).  Urine has 'large blood' today which is more likely to be myoglobin than hemoglobin.  Discussed the importance of fluids and the need for complete physical rest- including from work since he works outside.  Will get CK and BMP to assess kidney function.  Encouraged tylenol as needed for muscle pain and the need to avoid ibuprofen to protect the kidneys.  Told him that if his urine again changes color in the future, he should not wait a week to be evaluated.  Reviewed supportive care and red flags that should prompt return.  Pt expressed understanding and is in agreement w/ plan.

## 2021-11-22 ENCOUNTER — Telehealth: Payer: Self-pay

## 2021-11-22 ENCOUNTER — Other Ambulatory Visit: Payer: 59

## 2021-11-22 ENCOUNTER — Other Ambulatory Visit: Payer: Self-pay | Admitting: Family Medicine

## 2021-11-22 ENCOUNTER — Inpatient Hospital Stay (HOSPITAL_COMMUNITY)
Admission: EM | Admit: 2021-11-22 | Discharge: 2021-11-25 | DRG: 558 | Disposition: A | Discharge status: Home / Self Care | Payer: 59 | Attending: Internal Medicine | Admitting: Internal Medicine

## 2021-11-22 ENCOUNTER — Other Ambulatory Visit: Payer: Self-pay

## 2021-11-22 ENCOUNTER — Encounter (HOSPITAL_COMMUNITY): Payer: Self-pay

## 2021-11-22 DIAGNOSIS — M6282 Rhabdomyolysis: Secondary | ICD-10-CM

## 2021-11-22 DIAGNOSIS — F17200 Nicotine dependence, unspecified, uncomplicated: Secondary | ICD-10-CM

## 2021-11-22 DIAGNOSIS — E860 Dehydration: Secondary | ICD-10-CM | POA: Diagnosis present

## 2021-11-22 DIAGNOSIS — R82998 Other abnormal findings in urine: Secondary | ICD-10-CM

## 2021-11-22 DIAGNOSIS — F17290 Nicotine dependence, other tobacco product, uncomplicated: Secondary | ICD-10-CM | POA: Diagnosis present

## 2021-11-22 LAB — URINALYSIS, ROUTINE W REFLEX MICROSCOPIC
Bacteria, UA: NONE SEEN
Bilirubin Urine: NEGATIVE
Glucose, UA: 150 mg/dL — AB
Ketones, ur: NEGATIVE mg/dL
Leukocytes,Ua: NEGATIVE
Nitrite: NEGATIVE
Protein, ur: NEGATIVE mg/dL
Specific Gravity, Urine: 1.013 (ref 1.005–1.030)
pH: 8 (ref 5.0–8.0)

## 2021-11-22 LAB — CBC WITH DIFFERENTIAL/PLATELET
Abs Immature Granulocytes: 0.04 10*3/uL (ref 0.00–0.07)
Basophils Absolute: 0 10*3/uL (ref 0.0–0.1)
Basophils Relative: 0 %
Eosinophils Absolute: 0.4 10*3/uL (ref 0.0–0.5)
Eosinophils Relative: 6 %
HCT: 49 % (ref 39.0–52.0)
Hemoglobin: 16.3 g/dL (ref 13.0–17.0)
Immature Granulocytes: 1 %
Lymphocytes Relative: 20 %
Lymphs Abs: 1.2 10*3/uL (ref 0.7–4.0)
MCH: 30.2 pg (ref 26.0–34.0)
MCHC: 33.3 g/dL (ref 30.0–36.0)
MCV: 90.9 fL (ref 80.0–100.0)
Monocytes Absolute: 0.6 10*3/uL (ref 0.1–1.0)
Monocytes Relative: 10 %
Neutro Abs: 3.6 10*3/uL (ref 1.7–7.7)
Neutrophils Relative %: 63 %
Platelets: 221 10*3/uL (ref 150–400)
RBC: 5.39 MIL/uL (ref 4.22–5.81)
RDW: 12.6 % (ref 11.5–15.5)
WBC: 5.8 10*3/uL (ref 4.0–10.5)
nRBC: 0 % (ref 0.0–0.2)

## 2021-11-22 LAB — URINE CULTURE
MICRO NUMBER:: 13553971
Result:: NO GROWTH
SPECIMEN QUALITY:: ADEQUATE

## 2021-11-22 LAB — BASIC METABOLIC PANEL
Anion gap: 9 (ref 5–15)
BUN: 11 mg/dL (ref 6–20)
CO2: 30 mmol/L (ref 22–32)
Calcium: 9.6 mg/dL (ref 8.9–10.3)
Chloride: 100 mmol/L (ref 98–111)
Creatinine, Ser: 0.86 mg/dL (ref 0.61–1.24)
GFR, Estimated: >60 mL/min (ref >60)
Glucose, Bld: 96 mg/dL (ref 70–99)
Potassium: 4.2 mmol/L (ref 3.5–5.1)
Sodium: 139 mmol/L (ref 135–145)

## 2021-11-22 LAB — CK
Total CK: >18000 U/L — ABNORMAL HIGH (ref 7–232)
Total CK: >50000 U/L — ABNORMAL HIGH (ref 49–397)

## 2021-11-22 MED ORDER — ACETAMINOPHEN 325 MG PO TABS
650.0000 mg | ORAL_TABLET | Freq: Four times a day (QID) | ORAL | Status: DC | PRN
  Administered 2021-11-23 – 2021-11-24 (×4): 650 mg via ORAL
  Filled 2021-11-22 (×4): qty 2

## 2021-11-22 MED ORDER — SODIUM CHLORIDE 0.9 % IV SOLN: INTRAVENOUS | Status: DC

## 2021-11-22 MED ORDER — SODIUM CHLORIDE 0.9 % IV BOLUS
1000.0000 mL | Freq: Once | INTRAVENOUS | Status: AC
Start: 2021-11-22 — End: 2021-11-22
  Administered 2021-11-22: 1000 mL via INTRAVENOUS

## 2021-11-22 MED ORDER — ACETAMINOPHEN 650 MG RE SUPP: 650.0000 mg | Freq: Four times a day (QID) | RECTAL | Status: DC | PRN

## 2021-11-22 MED ORDER — OLOPATADINE HCL 0.1 % OP SOLN
1.0000 [drp] | Freq: Every day | OPHTHALMIC | Status: DC | PRN
  Filled 2021-11-22: qty 5

## 2021-11-22 MED ORDER — LORATADINE 10 MG PO TABS
10.0000 mg | ORAL_TABLET | Freq: Every day | ORAL | Status: DC
  Administered 2021-11-23 – 2021-11-25 (×3): 10 mg via ORAL
  Filled 2021-11-22 (×3): qty 1

## 2021-11-22 MED ORDER — NICOTINE 14 MG/24HR TD PT24: 14.0000 mg | MEDICATED_PATCH | Freq: Every day | TRANSDERMAL | Status: DC | PRN

## 2021-11-22 MED ORDER — ORAL CARE MOUTH RINSE: 15.0000 mL | OROMUCOSAL | Status: DC | PRN

## 2021-11-22 MED ORDER — SODIUM CHLORIDE 0.9 % IV BOLUS
1000.0000 mL | Freq: Once | INTRAVENOUS | Status: AC
  Administered 2021-11-22: 1000 mL via INTRAVENOUS

## 2021-11-22 MED ORDER — LACTATED RINGERS IV SOLN: INTRAVENOUS | Status: DC

## 2021-11-22 MED ORDER — ENOXAPARIN SODIUM 40 MG/0.4ML IJ SOSY
40.0000 mg | PREFILLED_SYRINGE | INTRAMUSCULAR | Status: DC
  Administered 2021-11-22 – 2021-11-24 (×3): 40 mg via SUBCUTANEOUS
  Filled 2021-11-22 (×3): qty 0.4

## 2021-11-22 NOTE — ED Provider Notes (Signed)
[facility]-EMERGENCY DEPT Provider Note   CSN: [id number] Arrival date & time: 11/22/21  1001     History  Chief Complaint  Patient presents with   Leg Pain   Abnormal Lab    Tanner Flores is a 26 y.o. male.  Pt is a 26 yo with no significant pmhx.  Pt said he has not worked out since [employer].  He decided to do a 3 hour intense work out with a friend (who has been working out) on Friday, 6/16.  He then developed severe thigh pain on the 19th.  He saw his pcp yesterday who checked labs.  Labs showed a CK more than 18,000.  His PCP recommended that he come to the ED.  Pt said his urine was initially very dark, but it is back to normal now.  Pt said his leg pain is still there, but it is much better.       Home Medications Prior to Admission medications   Medication Sig Start Date End Date Taking? Authorizing Provider  cetirizine (ZYRTEC ALLERGY) 10 MG tablet Take 1 tablet (10 mg total) by mouth 2 (two) times daily. Patient taking differently: Take 10 mg by mouth every morning. 08/20/21  Yes Ellamae Sia, DO  Chlorphen-Phenyleph-Ibuprofen (ADVIL ALLERGY & CONGESTION) 4-10-200 MG TABS Take 1 tablet by mouth 2 (two) times daily as needed (allergies (trees and pollen)).   Yes [provider]  ibuprofen (ADVIL) 200 MG tablet Take 200 mg by mouth 2 (two) times daily as needed for headache.   Yes [provider]  Olopatadine HCl 0.2 % SOLN Apply 1 drop to eye daily as needed (for itcy and watery eyes). Patient taking differently: Place 1 drop into both eyes daily as needed (itchy and watery eyes). 09/17/21  Yes Nehemiah Settle, FNP  triamcinolone cream (KENALOG) 0.1 % Apply 1 application. topically 2 (two) times daily. Patient taking differently: Apply 1 application  topically 2 (two) times daily as needed (severe itching). 08/15/21  Yes Janeece Agee, NP  albuterol (VENTOLIN HFA) 108 (90 Base) MCG/ACT inhaler Inhale 2 puffs into the lungs every 6  (six) hours as needed for wheezing or shortness of breath. Patient not taking: Reported on 11/22/2021 10/17/21   Sheliah Hatch, MD  famotidine (PEPCID) 20 MG tablet Take 1 tablet (20 mg total) by mouth 2 (two) times daily. Patient not taking: Reported on 11/22/2021 08/20/21   Ellamae Sia, DO  mometasone (ELOCON) 0.1 % ointment Apply topically daily as needed (rash). For thick, stubborn areas. Do not use on the face, neck, armpits or groin area. Do not use more than 2 weeks in a row. Patient not taking: Reported on 11/22/2021 09/17/21   Nehemiah Settle, FNP  montelukast (SINGULAIR) 10 MG tablet TAKE 1 TABLET BY MOUTH EVERYDAY AT BEDTIME Patient not taking: Reported on 11/22/2021 11/12/21   Janeece Agee, NP  promethazine-dextromethorphan (PROMETHAZINE-DM) 6.25-15 MG/5ML syrup Take 5 mLs by mouth 4 (four) times daily as needed. Patient not taking: Reported on 11/21/2021 10/17/21   Sheliah Hatch, MD      Allergies    Patient has no known allergies.    Review of Systems   Review of Systems  Musculoskeletal:        Bilateral thigh pain  All other systems reviewed and are negative.   Physical Exam Updated Vital Signs BP 122/70   Pulse (!) 56   Temp 98.6 F (37 C) (Oral)   Resp 18   Ht  5\' 11"  (1.803 m)   Wt 75.8 kg   SpO2 98%   BMI 23.29 kg/m  Physical Exam Vitals and nursing note reviewed.  Constitutional:      Appearance: Normal appearance.  HENT:     Head: Normocephalic and atraumatic.     Right Ear: External ear normal.     Left Ear: External ear normal.     Nose: Nose normal.     Mouth/Throat:     Mouth: Mucous membranes are moist.     Pharynx: Oropharynx is clear.  Eyes:     Extraocular Movements: Extraocular movements intact.     Conjunctiva/sclera: Conjunctivae normal.     Pupils: Pupils are equal, round, and reactive to light.  Cardiovascular:     Rate and Rhythm: Normal rate and regular rhythm.     Pulses: Normal pulses.     Heart sounds: Normal heart  sounds.  Pulmonary:     Effort: Pulmonary effort is normal.     Breath sounds: Normal breath sounds.  Abdominal:     General: Abdomen is flat. Bowel sounds are normal.     Palpations: Abdomen is soft.  Musculoskeletal:     Cervical back: Normal range of motion and neck supple.     Comments: Bilateral thighs with mild tenderness.  Compartments are soft.  Skin:    General: Skin is warm.     Capillary Refill: Capillary refill takes less than 2 seconds.  Neurological:     General: No focal deficit present.     Mental Status: He is alert and oriented to person, place, and time.  Psychiatric:        Mood and Affect: Mood normal.        Behavior: Behavior normal.     ED Results / Procedures / Treatments   Labs (all labs ordered are listed, but only abnormal results are displayed) Labs Reviewed  CK - Abnormal; Notable for the following components:      Result Value   Total CK >50,000 (*)    All other components within normal limits  URINALYSIS, ROUTINE W REFLEX MICROSCOPIC - Abnormal; Notable for the following components:   Glucose, UA 150 (*)    Hgb urine dipstick MODERATE (*)    All other components within normal limits  CBC WITH DIFFERENTIAL/PLATELET  BASIC METABOLIC PANEL    EKG None  Radiology No results found.  Procedures Procedures    Medications Ordered in ED Medications  sodium chloride 0.9 % bolus 1,000 mL (has no administration in time range)  sodium chloride 0.9 % bolus 1,000 mL (0 mLs Intravenous Stopped 11/22/21 1207)    ED Course/ Medical Decision Making/ A&P                           Medical Decision Making Amount and/or Complexity of Data Reviewed Labs: ordered.  Risk Decision regarding hospitalization.   This patient presents to the ED for concern of rhabdo, this involves an extensive number of treatment options, and is a complaint that carries with it a high risk of complications and morbidity.  The differential diagnosis includes  rhabdo   Co morbidities that complicate the patient evaluation  none   Additional history obtained:  Additional history obtained from epic chart review   Lab Tests:  I Ordered, and personally interpreted labs.  The pertinent results include:  cbc nl, bmp nl; ua + hgb (likely myoglobin); CK >50,000   Cardiac Monitoring:  The  patient was maintained on a cardiac monitor.  I personally viewed and interpreted the cardiac monitored which showed an underlying rhythm of: nsr   Medicines ordered and prescription drug management:  I ordered medication including IVFs  for rhabdo  Reevaluation of the patient after these medicines showed that the patient improved I have reviewed the patients home medicines and have made adjustments as needed  Critical Interventions:  IVFs   Consultations Obtained:  I requested consultation with the hospitalist (Dr. Ronaldo Miyamoto),  and discussed lab and imaging findings as well as pertinent plan - he will admit   Problem List / ED Course:  Rhabdomyolysis:  CK is worsening instead of getting better.  Luckily, kidney fct is normal.  Pt given IVFs.  As CK is worsening, he will need admission.  Pt d/w Dr. Ronaldo Miyamoto.   Reevaluation:  After the interventions noted above, I reevaluated the patient and found that they have :improved   Social Determinants of Health:  Lives at home   Dispostion:  After consideration of the diagnostic results and the patients response to treatment, I feel that the patent would benefit from admission.          Final Clinical Impression(s) / ED Diagnoses Final diagnoses:  Non-traumatic rhabdomyolysis    Rx / DC Orders ED Discharge Orders     None         Jacalyn Lefevre, MD 11/22/21 1257

## 2021-11-22 NOTE — Progress Notes (Signed)
Future CK ordered for pt's lab visit

## 2021-11-22 NOTE — ED Triage Notes (Signed)
Patient states he went to his PCP yesterday with c/o bilateral leg pain R>L  and "extremely dark urine".x 6 days.  Patient was called by the PCP today and told to come to the ED for elevated CK levels- > 18000.

## 2021-11-22 NOTE — ED Provider Triage Note (Cosign Needed)
Emergency Medicine Provider Triage Evaluation Note  Tanner Flores , a 26 y.o. male  was evaluated in triage.  Pt complains of dark urine and abnormal CK result.  Patient was seen at primary care yesterday and his CK came back at 18,000.  Patient states that a couple of days ago he worked out for 2-1/2 hours after not having worked out with [employer] for the past 6 or so years.  Patient states that his urine was super dark and this reason he went to get evaluated.  He does endorse mild generalized pain but states that he is a recovering opiate addict and wants no pain medicine at this time.  Review of Systems  Positive: Myalgias, dark urine Negative: Chest pain, shortness of breath  Physical Exam  BP 134/75 (BP Location: Left Arm)   Pulse 66   Temp 98.6 F (37 C) (Oral)   Resp 18   Ht 5\' 11"  (1.803 m)   Wt 75.8 kg   SpO2 99%   BMI 23.29 kg/m  Gen:   Awake, no distress   Resp:  Normal effort  MSK:   Moves extremities without difficulty  Other:    Medical Decision Making  Medically screening exam initiated at 10:38 AM.  Appropriate orders placed.  Ahkeem Goede was informed that the remainder of the evaluation will be completed by another provider, this initial triage assessment does not replace that evaluation, and the importance of remaining in the ED until their evaluation is complete.     Billey Chang, PA-C 11/22/21 1039

## 2021-11-22 NOTE — Telephone Encounter (Signed)
-----   Message from Sheliah Hatch, MD sent at 11/22/2021  9:10 AM EDT ----- Thankfully your kidney function is ok but your CK (muscle breakdown product) is off the charts.  Based on this, I need you to go to the Emergency Room ASAP to get fluids and monitor your levels.

## 2021-11-23 DIAGNOSIS — M6282 Rhabdomyolysis: Secondary | ICD-10-CM | POA: Diagnosis not present

## 2021-11-23 LAB — CBC
HCT: 42.2 % (ref 39.0–52.0)
Hemoglobin: 14.2 g/dL (ref 13.0–17.0)
MCH: 30.7 pg (ref 26.0–34.0)
MCHC: 33.6 g/dL (ref 30.0–36.0)
MCV: 91.3 fL (ref 80.0–100.0)
Platelets: 181 10*3/uL (ref 150–400)
RBC: 4.62 MIL/uL (ref 4.22–5.81)
RDW: 12.4 % (ref 11.5–15.5)
WBC: 6.1 10*3/uL (ref 4.0–10.5)
nRBC: 0 % (ref 0.0–0.2)

## 2021-11-23 LAB — COMPREHENSIVE METABOLIC PANEL
ALT: 294 U/L — ABNORMAL HIGH (ref 0–44)
AST: 759 U/L — ABNORMAL HIGH (ref 15–41)
Albumin: 3.5 g/dL (ref 3.5–5.0)
Alkaline Phosphatase: 62 U/L (ref 38–126)
Anion gap: 5 (ref 5–15)
BUN: 13 mg/dL (ref 6–20)
CO2: 28 mmol/L (ref 22–32)
Calcium: 8.8 mg/dL — ABNORMAL LOW (ref 8.9–10.3)
Chloride: 109 mmol/L (ref 98–111)
Creatinine, Ser: 0.87 mg/dL (ref 0.61–1.24)
GFR, Estimated: >60 mL/min (ref >60)
Glucose, Bld: 99 mg/dL (ref 70–99)
Potassium: 4.3 mmol/L (ref 3.5–5.1)
Sodium: 142 mmol/L (ref 135–145)
Total Bilirubin: 0.8 mg/dL (ref 0.3–1.2)
Total Protein: 5.6 g/dL — ABNORMAL LOW (ref 6.5–8.1)

## 2021-11-23 LAB — HIV ANTIBODY (ROUTINE TESTING W REFLEX): HIV Screen 4th Generation wRfx: NONREACTIVE

## 2021-11-23 LAB — CK: Total CK: 34139 U/L — ABNORMAL HIGH (ref 49–397)

## 2021-11-23 MED ORDER — MONTELUKAST SODIUM 5 MG PO CHEW
10.0000 mg | CHEWABLE_TABLET | Freq: Every day | ORAL | Status: DC
  Administered 2021-11-23 – 2021-11-24 (×2): 10 mg via ORAL
  Filled 2021-11-23 (×2): qty 2

## 2021-11-24 DIAGNOSIS — M6282 Rhabdomyolysis: Secondary | ICD-10-CM | POA: Diagnosis not present

## 2021-11-24 DIAGNOSIS — F17200 Nicotine dependence, unspecified, uncomplicated: Secondary | ICD-10-CM | POA: Diagnosis not present

## 2021-11-24 LAB — CK
Total CK: 14223 U/L — ABNORMAL HIGH (ref 49–397)
Total CK: 12663 U/L — ABNORMAL HIGH (ref 49–397)

## 2021-11-24 MED ORDER — LACTATED RINGERS IV BOLUS
1000.0000 mL | INTRAVENOUS | Status: AC
  Administered 2021-11-24 (×2): 1000 mL via INTRAVENOUS

## 2021-11-24 NOTE — Progress Notes (Signed)
PROGRESS NOTE    Tanner Flores  NWG:[id number] DOB: 03/14/1996 DOA: 11/22/2021 PCP: Tanner Agee, NP    Brief Narrative:  26 year old male admitted to the hospital after intense exercise with bilateral thigh pain.  Noticed darkening of his urine.  Found to have rhabdomyolysis with a CK level greater than 50,000.  Admitted for IV fluids.   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   Rhabdomyolysis -Secondary to intense exercise/dehydration -CK level greater than 50,000 on admission -Started on IV fluids -Renal function stable -CK level down to 12,663 -Continue IV fluids  Elevated LFTs -Secondary to rhabdo -repeat in AM    DVT prophylaxis: enoxaparin (LOVENOX) injection 40 mg Start: 11/22/21 1700  Code Status: Full code Family Communication: Discussed with patient Disposition Plan: Status is: Inpatient Remains inpatient appropriate because: Needs continued IV fluids for rhabdomyolysis     Consultants:    Procedures:    Antimicrobials:      Subjective: Soreness in his legs is better, he is able to ambulate   Objective: Vitals:   11/23/21 1352 11/23/21 2232 11/24/21 0514 11/24/21 1353  BP: 132/81 120/67 126/78 138/83  Pulse: (!) 56 (!) 52 (!) 53 60  Resp: 16 16 16 19   Temp: 98.4 F (36.9 C) 98.2 F (36.8 C) 97.8 F (36.6 C) 98.2 F (36.8 C)  TempSrc: Oral Oral Oral Oral  SpO2: 99% 96% 99% 100%  Weight:      Height:        Intake/Output Summary (Last 24 hours) at 11/24/2021 1739 Last data filed at 11/24/2021 1352 Gross per 24 hour  Intake 7772.52 ml  Output 1325 ml  Net 6447.52 ml   Filed Weights   11/22/21 1014  Weight: 75.8 kg    Examination:  General exam: Appears calm and comfortable  Respiratory system: Clear to auscultation. Respiratory effort normal. Cardiovascular system: S1 & S2 heard, RRR. No JVD, murmurs, rubs, gallops or clicks. No  pedal edema. Gastrointestinal system: Abdomen is nondistended, soft and nontender. No organomegaly or masses felt. Normal bowel sounds heard. Central nervous system: Alert and oriented. No focal neurological deficits. Extremities: Symmetric 5 x 5 power.  Thigh compartments are soft Skin: No rashes, lesions or ulcers Psychiatry: Judgement and insight appear normal. Mood & affect appropriate.     Data Reviewed: I have personally reviewed following labs and imaging studies  CBC: Recent Labs  Lab 11/22/21 1029 11/23/21 0358  WBC 5.8 6.1  NEUTROABS 3.6  --   HGB 16.3 14.2  HCT 49.0 42.2  MCV 90.9 91.3  PLT 221 181   Basic Metabolic Panel: Recent Labs  Lab 11/21/21 1142 11/22/21 1029 11/23/21 0358  NA 137 139 142  K 4.8 4.2 4.3  CL 100 100 109  CO2 33* 30 28  GLUCOSE 98 96 99  BUN 11 11 13   CREATININE 0.92 0.86 0.87  CALCIUM 9.6 9.6 8.8*   GFR: Estimated Creatinine Clearance: 138.2 mL/min (by C-G formula based on SCr of 0.87 mg/dL). Liver Function Tests: Recent Labs  Lab 11/23/21 0358  AST 759*  ALT 294*  ALKPHOS 62  BILITOT 0.8  PROT 5.6*  ALBUMIN 3.5   No results for input(s): "LIPASE", "AMYLASE" in the last 168 hours. No results for input(s): "AMMONIA" in the last 168 hours. Coagulation Profile: No results for input(s): "INR", "PROTIME" in the last 168 hours. Cardiac Enzymes: Recent Labs  Lab 11/21/21 1142 11/22/21 1039 11/23/21 0358  11/24/21 0350 11/24/21 1434  CKTOTAL >18,000* >50,000* 34,139* 14,223* 12,663*   BNP (last 3 results) No results for input(s): "PROBNP" in the last 8760 hours. HbA1C: No results for input(s): "HGBA1C" in the last 72 hours. CBG: No results for input(s): "GLUCAP" in the last 168 hours. Lipid Profile: No results for input(s): "CHOL", "HDL", "LDLCALC", "TRIG", "CHOLHDL", "LDLDIRECT" in the last 72 hours. Thyroid Function Tests: No results for input(s): "TSH", "T4TOTAL", "FREET4", "T3FREE", "THYROIDAB" in the last 72  hours. Anemia Panel: No results for input(s): "VITAMINB12", "FOLATE", "FERRITIN", "TIBC", "IRON", "RETICCTPCT" in the last 72 hours. Sepsis Labs: No results for input(s): "PROCALCITON", "LATICACIDVEN" in the last 168 hours.  Recent Results (from the past 240 hour(s))  Urine Culture     Status: None   Collection Time: 11/21/21 11:34 AM   Specimen: Urine  Result Value Ref Range Status   MICRO NUMBER: [id number]  Final   SPECIMEN QUALITY: Adequate  Final   Sample Source URINE  Final   STATUS: FINAL  Final   Result: No Growth  Final         Radiology Studies: No results found.      Scheduled Meds:  enoxaparin (LOVENOX) injection  40 mg Subcutaneous Q24H   loratadine  10 mg Oral Daily   montelukast  10 mg Oral QHS   Continuous Infusions:  sodium chloride 150 mL/hr at 11/24/21 1459   lactated ringers       LOS: 2 days    Time spent:    Erick Blinks, MD [facility]   If 7PM-7AM, please contact night-coverage [email or web address]  11/24/2021, 5:39 PM

## 2021-11-25 DIAGNOSIS — M6282 Rhabdomyolysis: Secondary | ICD-10-CM | POA: Diagnosis not present

## 2021-11-25 DIAGNOSIS — F17200 Nicotine dependence, unspecified, uncomplicated: Secondary | ICD-10-CM | POA: Diagnosis not present

## 2021-11-25 LAB — COMPREHENSIVE METABOLIC PANEL
ALT: 194 U/L — ABNORMAL HIGH (ref 0–44)
AST: 288 U/L — ABNORMAL HIGH (ref 15–41)
Albumin: 3.2 g/dL — ABNORMAL LOW (ref 3.5–5.0)
Alkaline Phosphatase: 63 U/L (ref 38–126)
Anion gap: 3 — ABNORMAL LOW (ref 5–15)
BUN: 13 mg/dL (ref 6–20)
CO2: 31 mmol/L (ref 22–32)
Calcium: 8.8 mg/dL — ABNORMAL LOW (ref 8.9–10.3)
Chloride: 108 mmol/L (ref 98–111)
Creatinine, Ser: 0.79 mg/dL (ref 0.61–1.24)
GFR, Estimated: >60 mL/min (ref >60)
Glucose, Bld: 96 mg/dL (ref 70–99)
Potassium: 4.6 mmol/L (ref 3.5–5.1)
Sodium: 142 mmol/L (ref 135–145)
Total Bilirubin: 0.8 mg/dL (ref 0.3–1.2)
Total Protein: 5.5 g/dL — ABNORMAL LOW (ref 6.5–8.1)

## 2021-11-25 LAB — CK: Total CK: 6898 U/L — ABNORMAL HIGH (ref 49–397)

## 2021-11-26 ENCOUNTER — Telehealth: Payer: Self-pay | Admitting: *Deleted

## 2021-11-29 ENCOUNTER — Other Ambulatory Visit: Payer: Self-pay

## 2021-11-29 ENCOUNTER — Ambulatory Visit (INDEPENDENT_AMBULATORY_CARE_PROVIDER_SITE_OTHER): Payer: 59 | Admitting: Registered Nurse

## 2021-11-29 VITALS — BP 118/66 | HR 83 | Temp 98.4°F | Resp 18 | Ht 71.0 in | Wt 170.6 lb

## 2021-11-29 DIAGNOSIS — M6282 Rhabdomyolysis: Secondary | ICD-10-CM | POA: Diagnosis not present

## 2021-11-29 DIAGNOSIS — Z09 Encounter for follow-up examination after completed treatment for conditions other than malignant neoplasm: Secondary | ICD-10-CM | POA: Diagnosis not present

## 2021-11-29 LAB — COMPREHENSIVE METABOLIC PANEL
ALT: 162 U/L — ABNORMAL HIGH (ref 0–53)
AST: 84 U/L — ABNORMAL HIGH (ref 0–37)
Albumin: 4.5 g/dL (ref 3.5–5.2)
Alkaline Phosphatase: 70 U/L (ref 39–117)
BUN: 13 mg/dL (ref 6–23)
CO2: 32 mEq/L (ref 19–32)
Calcium: 9.6 mg/dL (ref 8.4–10.5)
Chloride: 100 mEq/L (ref 96–112)
Creatinine, Ser: 0.86 mg/dL (ref 0.40–1.50)
GFR: 120.63 mL/min (ref >60.00)
Glucose, Bld: 101 mg/dL — ABNORMAL HIGH (ref 70–99)
Potassium: 4.5 mEq/L (ref 3.5–5.1)
Sodium: 137 mEq/L (ref 135–145)
Total Bilirubin: 0.7 mg/dL (ref 0.2–1.2)
Total Protein: 7 g/dL (ref 6.0–8.3)

## 2021-11-29 LAB — CK: Total CK: 737 U/L — ABNORMAL HIGH (ref 7–232)

## 2021-11-29 NOTE — Progress Notes (Signed)
FYI - seems like an appropriate downward trend with clinical improvement. I think this is ok, but wanted you to take a quick look to confirm.  Thanks,  Luan Pulling

## 2021-11-29 NOTE — Progress Notes (Signed)
Established Patient Office Visit  Subjective:  Patient ID: Tanner Flores, male    DOB: 1995-07-03  Age: 26 y.o. MRN: [id number]  CC:  Chief Complaint  Patient presents with   Hospitalization Follow-up    Patient states he is here for a hospital follow up and states he is feeling a little better but still some leg pain and discomfort    HPI Tanner Flores presents for HFU  Was seen in this office by my colleague Dr. Beverely Low on 11/22/21 c/o muscle aches and dark urine. Labs revealed CK of >18,000. Referred to ED where CK noted to be >50,000 on arrival. Admitted x 3 days for fluids and stabilization of exercise induced rhabdomyolosis.   DC on 11/25/21 with CK around 7000.  LFTs elevated but normalizing throughout hospitalization.   Today he is feeling better. Still some stiffness in thighs, but much improved Drinking around 1 gallon of water daily. Urine is yellow/straw. No instances of tea/coke colored urine since dc.  No abd pain, nvd, chest pain, or other changes.   Notes he does not plan to exercise as intensely in the future.   Outpatient Medications Prior to Visit  Medication Sig Dispense Refill   cetirizine (ZYRTEC ALLERGY) 10 MG tablet Take 1 tablet (10 mg total) by mouth 2 (two) times daily. 60 tablet 3   albuterol (VENTOLIN HFA) 108 (90 Base) MCG/ACT inhaler Inhale 2 puffs into the lungs every 6 (six) hours as needed for wheezing or shortness of breath. (Patient not taking: Reported on 11/22/2021) 8 g 0   Chlorphen-Phenyleph-Ibuprofen (ADVIL ALLERGY & CONGESTION) 4-10-200 MG TABS Take 1 tablet by mouth 2 (two) times daily as needed (allergies (trees and pollen)). (Patient not taking: Reported on 11/29/2021)     famotidine (PEPCID) 20 MG tablet Take 1 tablet (20 mg total) by mouth 2 (two) times daily. (Patient not taking: Reported on 11/22/2021) 60 tablet 3   mometasone (ELOCON) 0.1 % ointment Apply topically daily as needed (rash). For thick, stubborn areas. Do not use on the  face, neck, armpits or groin area. Do not use more than 2 weeks in a row. (Patient not taking: Reported on 11/22/2021) 45 g 0   montelukast (SINGULAIR) 10 MG tablet TAKE 1 TABLET BY MOUTH EVERYDAY AT BEDTIME (Patient not taking: Reported on 11/22/2021) 90 tablet 1   Olopatadine HCl 0.2 % SOLN Apply 1 drop to eye daily as needed (for itcy and watery eyes). (Patient not taking: Reported on 11/29/2021) 2.5 mL 5   promethazine-dextromethorphan (PROMETHAZINE-DM) 6.25-15 MG/5ML syrup Take 5 mLs by mouth 4 (four) times daily as needed. (Patient not taking: Reported on 11/21/2021) 240 mL 0   triamcinolone cream (KENALOG) 0.1 % Apply 1 application. topically 2 (two) times daily. (Patient not taking: Reported on 11/29/2021) 30 g 0   No facility-administered medications prior to visit.    Review of Systems    Objective:     BP 118/66   Pulse 83   Temp 98.4 F (36.9 C) (Temporal)   Resp 18   Ht 5\' 11"  (1.803 m)   Wt 170 lb 9.6 oz (77.4 kg)   SpO2 99%   BMI 23.79 kg/m   Wt Readings from Last 3 Encounters:  11/29/21 170 lb 9.6 oz (77.4 kg)  11/22/21 167 lb (75.8 kg)  11/21/21 166 lb 8 oz (75.5 kg)   Physical Exam  No results found for any visits on 11/29/21.    The ASCVD Risk score (Arnett DK, et al., 2019) failed  to calculate for the following reasons:   The 2019 ASCVD risk score is only valid for ages 41 to 81    Assessment & Plan:   Problem List Items Addressed This Visit       Musculoskeletal and Integument   Rhabdomyolysis   Relevant Orders   Comprehensive metabolic panel   CK   Other Visit Diagnoses     Hospital discharge follow-up    -  Primary       No orders of the defined types were placed in this encounter.   Return in about 3 months (around 03/01/2022) for recheck on CMP.    Janeece Agee, NP

## 2021-11-29 NOTE — Patient Instructions (Addendum)
  Mr. Tanner Flores to see you  Keep drinking plenty of water  Let me know if things regress/ get worse  I'll be in touch with lab results  Thanks,  Rich    If you have lab work done today you will be contacted with your lab results within the next 2 weeks.  If you have not heard from [place] then please contact us. The fastest way to get your results is to register for My Chart.   IF you received an x-ray today, you will receive an invoice from [facility]. Please contact [facility] at [phone] with questions or concerns regarding your invoice.   IF you received labwork today, you will receive an invoice from [place]. Please contact [facility] at [phone] with questions or concerns regarding your invoice.   Our billing staff will not be able to assist you with questions regarding bills from these companies.  You will be contacted with the lab results as soon as they are available. The fastest way to get your results is to activate your My Chart account. Instructions are located on the last page of this paperwork. If you have not heard from [place] regarding the results in 2 weeks, please contact this office.

## 2021-12-31 ENCOUNTER — Ambulatory Visit: Payer: 59 | Admitting: Allergy

## 2022-03-06 ENCOUNTER — Ambulatory Visit: Payer: 59 | Admitting: Registered Nurse

## 2022-07-31 ENCOUNTER — Ambulatory Visit
Admission: EM | Admit: 2022-07-31 | Discharge: 2022-07-31 | Disposition: A | Discharge status: Home / Self Care | Payer: Commercial Managed Care - HMO | Attending: Emergency Medicine | Admitting: Emergency Medicine

## 2022-07-31 DIAGNOSIS — B349 Viral infection, unspecified: Secondary | ICD-10-CM

## 2022-07-31 LAB — POCT INFLUENZA A/B
Influenza A, POC: NEGATIVE
Influenza B, POC: NEGATIVE

## 2022-07-31 NOTE — ED Triage Notes (Signed)
Patient to Urgent Care with complaints of generalized body aches and hot flashes/ chills. Symptoms started yesterday. Possible fevers. Treating with tylenol.   Girlfriend is flu positive.

## 2022-07-31 NOTE — ED Provider Notes (Signed)
[facility]    CSN: [id number] Arrival date & time: 07/31/22  1138      History   Chief Complaint Chief Complaint  Patient presents with   Generalized Body Aches    HPI Tanner Flores is a 27 y.o. male.  Patient presents with subjective fever, chills, body aches, malaise x 1 day.  He denies ear pain, sore throat, cough, shortness of breath, vomiting, diarrhea, or other symptoms.  Tylenol taken at 0600 this morning.  His girlfriend has the flu.  His medical history includes rhabdomyolysis, anxiety, depression, insomnia.    The history is provided by the patient and medical records.    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   Psychophysiological insomnia 08/10/2019   GAD (generalized anxiety disorder) 08/10/2019   Major depressive disorder with current active episode 12/15/2017   Right knee pain 01/02/2017   Acute mesenteric adenitis 07/19/2016   Right lower quadrant abdominal pain 07/17/2016    Past Surgical History:  Procedure Laterality Date   LAPAROSCOPIC APPENDECTOMY N/A 07/18/2016   Procedure: APPENDECTOMY LAPAROSCOPIC;  Surgeon: Jackolyn Confer, MD;  Location: [facility];  Service: General;  Laterality: N/A;   WISDOM TOOTH EXTRACTION  04/2016   x4       Home Medications    Prior to Admission medications   Medication Sig Start Date End Date Taking? Authorizing Provider  albuterol (VENTOLIN HFA) 108 (90 Base) MCG/ACT inhaler Inhale 2 puffs into the lungs every 6 (six) hours as needed for wheezing or shortness of breath. Patient not taking: Reported on 11/22/2021 10/17/21   Midge Minium, MD  cetirizine (ZYRTEC ALLERGY) 10 MG tablet Take 1 tablet (10 mg total)  by mouth 2 (two) times daily. 08/20/21   Garnet Sierras, DO  Chlorphen-Phenyleph-Ibuprofen (ADVIL ALLERGY & CONGESTION) 4-10-200 MG TABS Take 1 tablet by mouth 2 (two) times daily as needed (allergies (trees and pollen)). Patient not taking: Reported on 11/29/2021    [provider]  famotidine (PEPCID) 20 MG tablet Take 1 tablet (20 mg total) by mouth 2 (two) times daily. Patient not taking: Reported on 11/22/2021 08/20/21   Garnet Sierras, DO  mometasone (ELOCON) 0.1 % ointment Apply topically daily as needed (rash). For thick, stubborn areas. Do not use on the face, neck, armpits or groin area. Do not use more than 2 weeks in a row. Patient not taking: Reported on 11/22/2021 09/17/21   Althea Charon, FNP  montelukast (SINGULAIR) 10 MG tablet TAKE 1 TABLET BY MOUTH EVERYDAY AT BEDTIME Patient not taking: Reported on 11/22/2021 11/12/21   Maximiano Coss, NP  Olopatadine HCl 0.2 % SOLN Apply 1 drop to eye daily as needed (for itcy and watery eyes). Patient not taking: Reported on 11/29/2021 09/17/21   Althea Charon, FNP  promethazine-dextromethorphan (PROMETHAZINE-DM) 6.25-15 MG/5ML syrup Take 5 mLs by mouth 4 (four) times daily as needed. Patient not taking: Reported on 11/21/2021 10/17/21   Midge Minium, MD  triamcinolone cream (KENALOG) 0.1 % Apply 1 application. topically 2 (two) times daily. Patient not taking: Reported on 11/29/2021 08/15/21   Maximiano Coss, NP    Family History Family History  Problem Relation Age of Onset   Diabetes Maternal Grandmother  Lung cancer Maternal Grandmother    Heart disease Maternal Grandfather    Lung cancer Maternal Grandfather    Diabetes Paternal Grandmother     Social History Social History   Tobacco Use   Smoking status: Never   Smokeless tobacco: Never  Vaping Use   Vaping Use: Never used  Substance Use Topics   Alcohol use: No   Drug use: Not Currently     Allergies   Patient has no known allergies.   Review of  Systems Review of Systems  Constitutional:  Positive for chills, fatigue and fever.  HENT:  Negative for ear pain and sore throat.   Respiratory:  Negative for cough and shortness of breath.   Cardiovascular:  Negative for chest pain and palpitations.  Gastrointestinal:  Negative for abdominal pain, diarrhea and vomiting.  Skin:  Negative for rash.  All other systems reviewed and are negative.    Physical Exam Triage Vital Signs ED Triage Vitals  Enc Vitals Group     BP      Pulse      Resp      Temp      Temp src      SpO2      Weight      Height      Head Circumference      Peak Flow      Pain Score      Pain Loc      Pain Edu?      Excl. in [place]?    No data found.  Updated Vital Signs BP 120/79   Pulse 72   Temp 98.3 F (36.8 C)   Resp 18   SpO2 98%   Visual Acuity Right Eye Distance:   Left Eye Distance:   Bilateral Distance:    Right Eye Near:   Left Eye Near:    Bilateral Near:     Physical Exam Vitals and nursing note reviewed.  Constitutional:      General: He is not in acute distress.    Appearance: Normal appearance. He is well-developed. He is ill-appearing.  HENT:     Right Ear: Tympanic membrane normal.     Left Ear: Tympanic membrane normal.     Nose: Nose normal.     Mouth/Throat:     Mouth: Mucous membranes are moist.     Pharynx: Oropharynx is clear.  Cardiovascular:     Rate and Rhythm: Normal rate and regular rhythm.     Heart sounds: Normal heart sounds.  Pulmonary:     Effort: Pulmonary effort is normal. No respiratory distress.     Breath sounds: Normal breath sounds.  Musculoskeletal:     Cervical back: Neck supple.  Skin:    General: Skin is warm and dry.  Neurological:     Mental Status: He is alert.  Psychiatric:        Mood and Affect: Mood normal.        Behavior: Behavior normal.      UC Treatments / Results  Labs (all labs ordered are listed, but only abnormal results are displayed) Labs Reviewed  POCT  INFLUENZA A/B    EKG   Radiology No results found.  Procedures Procedures (including critical care time)  Medications Ordered in UC Medications - No data to display  Initial Impression / Assessment and Plan / UC Course  I have reviewed the triage vital signs and the nursing notes.  Pertinent labs & imaging results that were available  during my care of the patient were reviewed by me and considered in my medical decision making (see chart for details).    Viral illness.  Rapid flu negative.  Patient declines COVID test. Discussed symptomatic treatment including Tylenol or ibuprofen, rest, hydration.  Instructed patient to follow up with his PCP if symptoms are not improving.  Education provided on viral illness.  He agrees to plan of care.   Final Clinical Impressions(s) / UC Diagnoses   Final diagnoses:  Viral illness     Discharge Instructions      Your flu test is negative.    Take Tylenol as needed for fever or discomfort.  Rest and keep yourself hydrated.    Follow-up with your primary care provider if your symptoms are not improving.         ED Prescriptions   None    PDMP not reviewed this encounter.   Sharion Balloon, NP 07/31/22 [phone]

## 2022-07-31 NOTE — Discharge Instructions (Addendum)
Your flu test is negative.    Take Tylenol as needed for fever or discomfort.  Rest and keep yourself hydrated.    Follow-up with your primary care provider if your symptoms are not improving.

## 2022-08-01 ENCOUNTER — Ambulatory Visit: Payer: Commercial Managed Care - HMO | Admitting: Family

## 2022-11-19 ENCOUNTER — Ambulatory Visit
Admission: EM | Admit: 2022-11-19 | Discharge: 2022-11-19 | Disposition: A | Discharge status: Home / Self Care | Payer: Commercial Managed Care - HMO | Attending: Urgent Care | Admitting: Urgent Care

## 2022-11-19 ENCOUNTER — Encounter: Payer: Self-pay | Admitting: Emergency Medicine

## 2022-11-19 ENCOUNTER — Ambulatory Visit (INDEPENDENT_AMBULATORY_CARE_PROVIDER_SITE_OTHER): Payer: Commercial Managed Care - HMO

## 2022-11-19 DIAGNOSIS — M7989 Other specified soft tissue disorders: Secondary | ICD-10-CM

## 2022-11-19 DIAGNOSIS — M79661 Pain in right lower leg: Secondary | ICD-10-CM | POA: Diagnosis not present

## 2022-11-19 DIAGNOSIS — R1031 Right lower quadrant pain: Secondary | ICD-10-CM

## 2022-11-19 NOTE — Discharge Instructions (Signed)
X-rays were negative for fracture.  Recommend you use Tylenol/ibuprofen to manage your pain.  Given the mechanism of injury, in addition to bruising, you may have a soft tissue injury such as a sprain.  As a result we are recommending:  Rest Ice Compression Elevation  I suggest you wear an ankle brace to provide compression and stabilization of the ankle.  Try to stay off the ankle as much as possible and keep it elevated above the level of your heart.  Use a cold pack or a bag of ice wrapped in a moist towel and place it on the injury for 15 minutes on/off as frequently as possible.  This will help to reduce swelling, pain, and limit bruising.  If your symptoms do not start to improve within a few days and do not significantly improve within a week, I recommend evaluation by an orthopedic provider.  I have provided information for [facility], a local orthopedic urgent care.  Alternately, you can ask your primary care provider for a referral to another orthopedic doctor.

## 2022-11-19 NOTE — ED Triage Notes (Signed)
Patient reports he slipped off the tailgate, rolled ankle, hit shinbone, walked it off. Went to work on [employer] duty yesterday. Pain, bruising, and swelling has been increasing since injury. Now reports the sensation of glass shards over damaged area. Palpable, strong DP pulse present with brisk cap refill in large toe of affect foot. Does report a decreased sensation in effected foot.

## 2022-11-19 NOTE — ED Notes (Signed)
Patient refused cam boot, left with ace wrap

## 2022-11-19 NOTE — ED Provider Notes (Signed)
Tanner Flores    CSN: [id number] Arrival date & time: 11/19/22  0844      History   Chief Complaint Chief Complaint  Patient presents with   Leg Injury    Entered by patient    HPI Tanner Flores is a 27 y.o. male.   HPI  Presents to urgent care for evaluation after an injury 3 days ago.  Patient states he slipped off the tailgate of a truck rolled his ankle and struck his shin.  Reports "walking it off" and some weight-bearing immediately after the injury but worsening pain, bruising, swelling since.  Reports sensation of "glass shards" over the site.    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   Psychophysiological insomnia 08/10/2019   GAD (generalized anxiety disorder) 08/10/2019   Major depressive disorder with current active episode 12/15/2017   Right knee pain 01/02/2017   Acute mesenteric adenitis 07/19/2016   Right lower quadrant abdominal pain 07/17/2016    Past Surgical History:  Procedure Laterality Date   LAPAROSCOPIC APPENDECTOMY N/A 07/18/2016   Procedure: APPENDECTOMY LAPAROSCOPIC;  Surgeon: Avel Peace, MD;  Location: [facility];  Service: General;  Laterality: N/A;   WISDOM TOOTH EXTRACTION  04/2016   x4       Home Medications    Prior to Admission medications   Medication Sig Start Date End Date Taking? Authorizing Provider  cetirizine (ZYRTEC ALLERGY) 10 MG tablet Take 1 tablet (10 mg total) by mouth 2 (two) times daily. 08/20/21  Yes Ellamae Sia, DO  albuterol (VENTOLIN HFA) 108 (90 Base) MCG/ACT inhaler Inhale 2 puffs into the lungs every 6 (six) hours as needed for wheezing or shortness of breath. Patient not taking: Reported on 11/22/2021  10/17/21   Sheliah Hatch, MD  Chlorphen-Phenyleph-Ibuprofen (ADVIL ALLERGY & CONGESTION) 4-10-200 MG TABS Take 1 tablet by mouth 2 (two) times daily as needed (allergies (trees and pollen)). Patient not taking: Reported on 11/29/2021    [provider]  famotidine (PEPCID) 20 MG tablet Take 1 tablet (20 mg total) by mouth 2 (two) times daily. Patient not taking: Reported on 11/22/2021 08/20/21   Ellamae Sia, DO  mometasone (ELOCON) 0.1 % ointment Apply topically daily as needed (rash). For thick, stubborn areas. Do not use on the face, neck, armpits or groin area. Do not use more than 2 weeks in a row. Patient not taking: Reported on 11/22/2021 09/17/21   Nehemiah Settle, FNP  montelukast (SINGULAIR) 10 MG tablet TAKE 1 TABLET BY MOUTH EVERYDAY AT BEDTIME Patient not taking: Reported on 11/22/2021 11/12/21   Janeece Agee, NP  Olopatadine HCl 0.2 % SOLN Apply 1 drop to eye daily as needed (for itcy and watery eyes). Patient not taking: Reported on 11/29/2021 09/17/21   Nehemiah Settle, FNP  promethazine-dextromethorphan (PROMETHAZINE-DM) 6.25-15 MG/5ML syrup Take 5 mLs by mouth 4 (four) times daily as needed. Patient not taking: Reported on 11/21/2021 10/17/21   Sheliah Hatch, MD  triamcinolone cream (KENALOG) 0.1 % Apply 1 application. topically 2 (two) times daily. Patient not taking: Reported on 11/29/2021 08/15/21   Janeece Agee, NP    Family History Family History  Problem Relation Age of Onset   Diabetes Maternal Grandmother  Lung cancer Maternal Grandmother    Heart disease Maternal Grandfather    Lung cancer Maternal Grandfather    Diabetes Paternal Grandmother     Social History Social History   Tobacco Use   Smoking status: Never   Smokeless tobacco: Never  Vaping Use   Vaping Use: Never used  Substance Use Topics   Alcohol use: No   Drug use: Not Currently     Allergies   Patient has no known allergies.   Review of Systems Review of  Systems   Physical Exam Triage Vital Signs ED Triage Vitals [11/19/22 0900]  Enc Vitals Group     BP 131/76     Pulse Rate 68     Resp 16     Temp 98.9 F (37.2 C)     Temp Source Oral     SpO2 97 %     Weight      Height      Head Circumference      Peak Flow      Pain Score      Pain Loc      Pain Edu?      Excl. in [place]?    No data found.  Updated Vital Signs BP 131/76 (BP Location: Left Arm)   Pulse 68   Temp 98.9 F (37.2 C) (Oral)   Resp 16   SpO2 97%   Visual Acuity Right Eye Distance:   Left Eye Distance:   Bilateral Distance:    Right Eye Near:   Left Eye Near:    Bilateral Near:     Physical Exam Vitals reviewed.  Constitutional:      Appearance: Normal appearance.  Musculoskeletal:     Right lower leg: Tenderness and bony tenderness present. No deformity or lacerations.     Right ankle: Tenderness present over the medial malleolus. No lateral malleolus or base of 5th metatarsal tenderness.     Right Achilles Tendon: No tenderness or defects. Thompson's test negative.       Legs:       Feet:  Skin:    General: Skin is warm and dry.  Neurological:     General: No focal deficit present.     Mental Status: He is alert and oriented to person, place, and time.  Psychiatric:        Mood and Affect: Mood normal.        Behavior: Behavior normal.      UC Treatments / Results  Labs (all labs ordered are listed, but only abnormal results are displayed) Labs Reviewed - No data to display  EKG   Radiology No results found.  Procedures Procedures (including critical care time)  Medications Ordered in UC Medications - No data to display  Initial Impression / Assessment and Plan / UC Course  I have reviewed the triage vital signs and the nursing notes.  Pertinent labs & imaging results that were available during my care of the patient were reviewed by me and considered in my medical decision making (see chart for details).   Tanner Flores is a 27 y.o. male presenting with R lower leg pain and swelling following an injuryt. Patient is afebrile without recent antipyretics, satting well on room air. Overall is well appearing, well hydrated, without respiratory distress. Erythema, edema and exquisite tenderness at the medial aspect of the R lower leg and ankle (medial malleolus).  Reviewed relevant chart history.  Concern for fracture vs soft tissue injury. Xrays ordered.  Impression:  Medial soft tissue swelling. No acute osseous finding.   Recommending ibuprofen and Tylenol for pain relief.  Cold therapy.  Possible soft tissue injury to the ankle/sprain and will recommend RICE, use of ankle brace which the patient will obtain at the pharmacy.  Final Clinical Impressions(s) / UC Diagnoses   Final diagnoses:  None   Discharge Instructions   None    ED Prescriptions   None    PDMP not reviewed this encounter.   Charma Igo, [place] 11/19/22 1008

## 2022-11-22 ENCOUNTER — Telehealth: Payer: Self-pay
